# Patient Record
Sex: Male | Born: 1940 | Race: White | Hispanic: No | Marital: Married
Health system: Midwestern US, Academic
[De-identification: ages and names within clinical notes are randomized; demographics above are authoritative.]

---

## 2020-09-17 ENCOUNTER — Encounter: Admit: 2020-09-17 | Discharge: 2020-09-17

## 2020-09-17 DIAGNOSIS — R319 Hematuria, unspecified: Secondary | ICD-10-CM

## 2020-09-17 DIAGNOSIS — N281 Cyst of kidney, acquired: Secondary | ICD-10-CM

## 2020-10-13 ENCOUNTER — Encounter: Admit: 2020-10-13 | Discharge: 2020-10-13 | Payer: MEDICARE

## 2020-10-13 DIAGNOSIS — R319 Hematuria, unspecified: Secondary | ICD-10-CM

## 2020-10-13 DIAGNOSIS — N281 Cyst of kidney, acquired: Secondary | ICD-10-CM

## 2020-10-26 ENCOUNTER — Encounter: Admit: 2020-10-26 | Discharge: 2020-10-26 | Payer: MEDICARE

## 2020-10-26 ENCOUNTER — Ambulatory Visit: Admit: 2020-10-26 | Discharge: 2020-10-26 | Payer: MEDICARE

## 2020-10-26 DIAGNOSIS — R319 Hematuria, unspecified: Secondary | ICD-10-CM

## 2020-10-26 DIAGNOSIS — N281 Cyst of kidney, acquired: Secondary | ICD-10-CM

## 2020-10-26 MED ORDER — GADOBENATE DIMEGLUMINE 529 MG/ML (0.1MMOL/0.2ML) IV SOLN
20 mL | Freq: Once | INTRAVENOUS | 0 refills | Status: CP
Start: 2020-10-26 — End: ?
  Administered 2020-10-27: 20 mL via INTRAVENOUS

## 2021-05-27 IMAGING — MR MR abdomen wo/w con
6 of 15 series · 21 of 48 positions shown · IV contrast (agent unspecified)
Comparison: CT abdomen with IV contrast from outside institution from 10/10/2016. CT abdomen outside institution 05/26/2016

STUDY TYPE: MRI abdomen without and with IV contrast and MRI pelvis without and with IV contrast
TECHNIQUE: Multiplanar multi echo MR imaging was acquired through the abdomen and pelvis without and with IV contrast. 10 cc multi and contrast medium was injected for the postcontrast portion of the examination
HISTORY: Renal mass follow-up, enlarged prostate, erectile dysfunction

[Series 101: survey · axial · 10.0mm · 1.56mm/px · 1 of 15 slices shown]
[im 1/15]
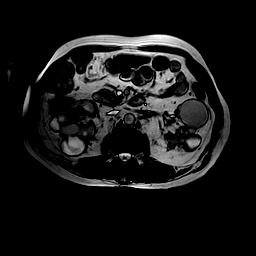

[Series 301: T1 fat-sat · axial · 4.0mm · 0.78mm/px · z∈[-150,+100]mm · 3 of 60 slices shown]
[im 1/60]
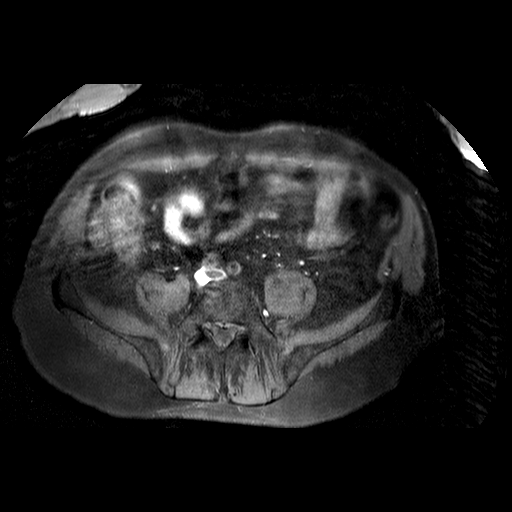
[im 30/60]
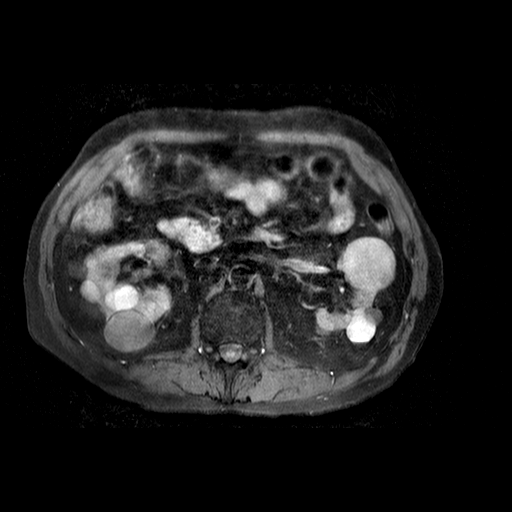
[im 60/60]
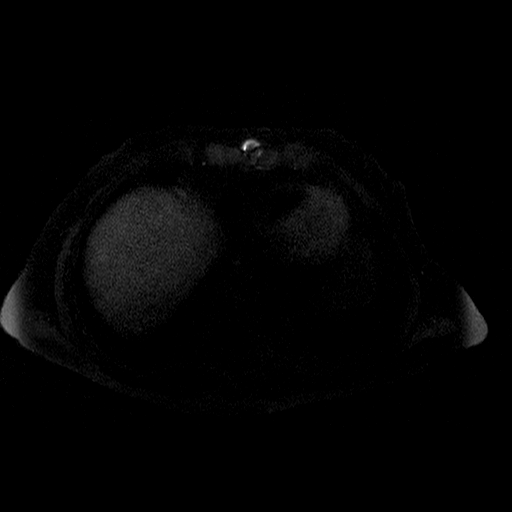

[Series 401: t2w_tse fatsat · axial · 4.0mm · 0.76mm/px · z∈[-121,+90]mm · 3 of 50 slices shown]
[im 1/50]
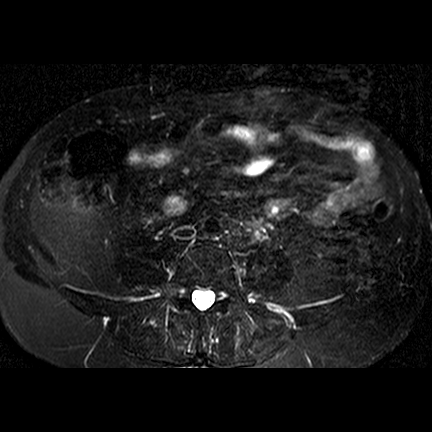
[im 25/50]
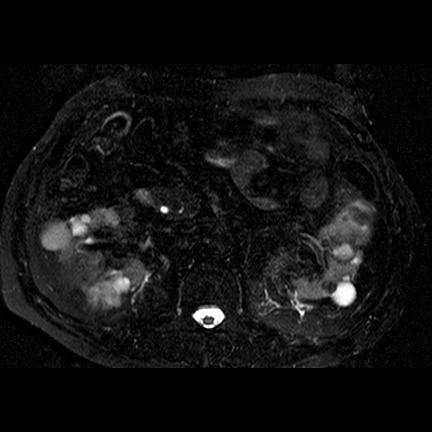
[im 50/50]
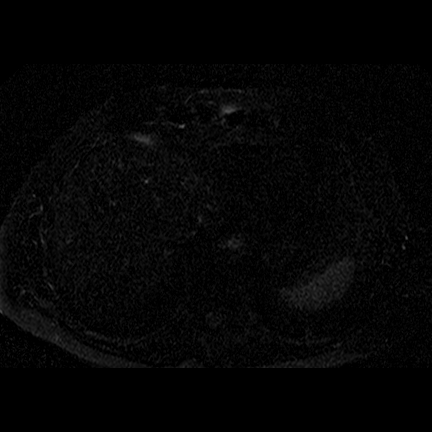

[Series 501: in/out phase · axial · 4.0mm · 0.78mm/px · z∈[-141,+109]mm · 8 of 120 slices shown]
[im 1/120]
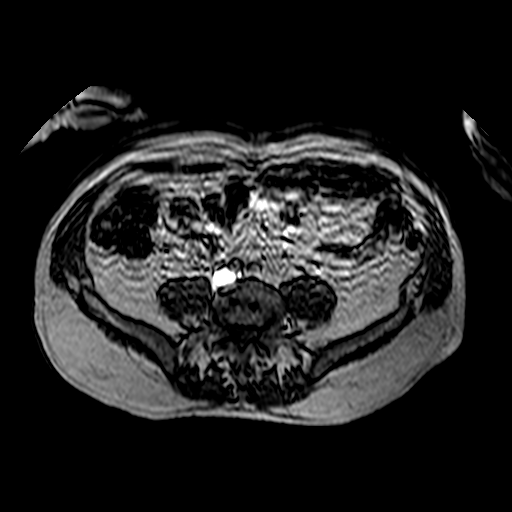
[im 18/120]
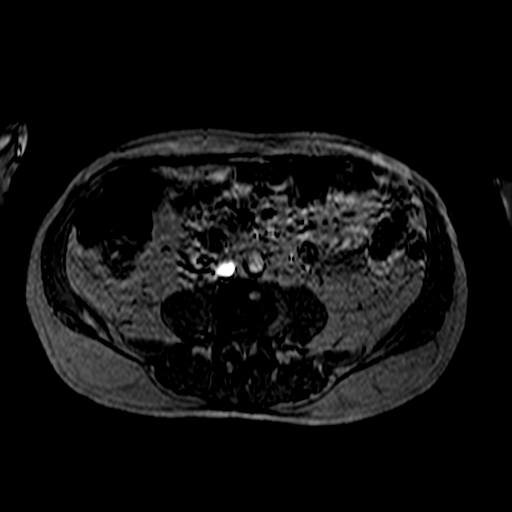
[im 35/120]
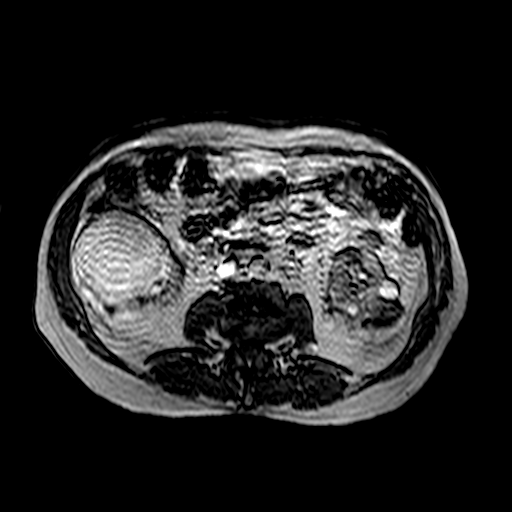
[im 52/120]
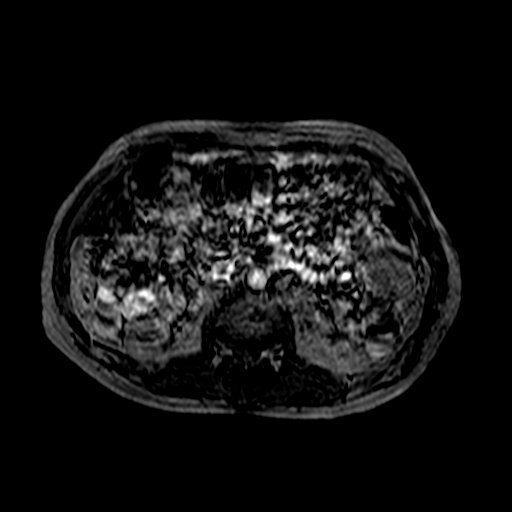
[im 69/120]
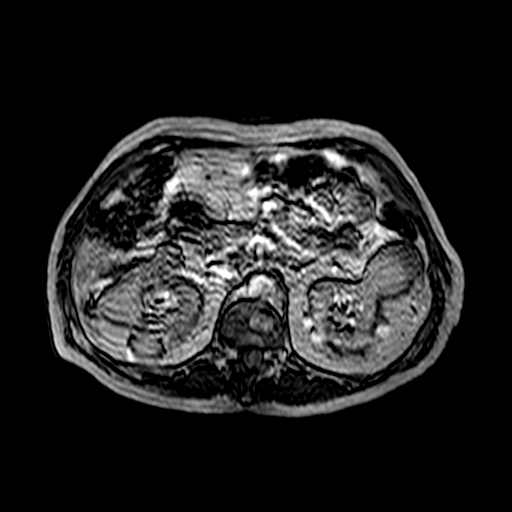
[im 86/120]
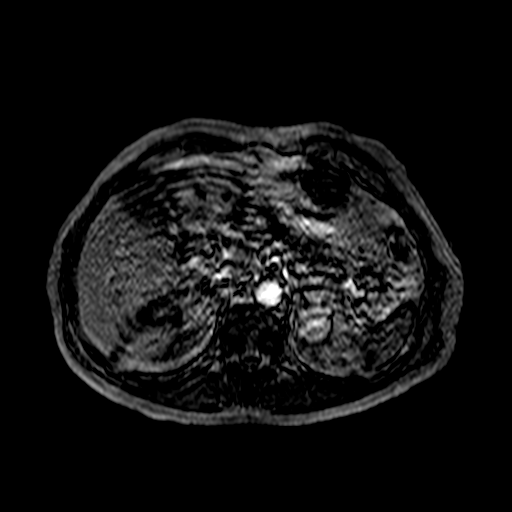
[im 103/120]
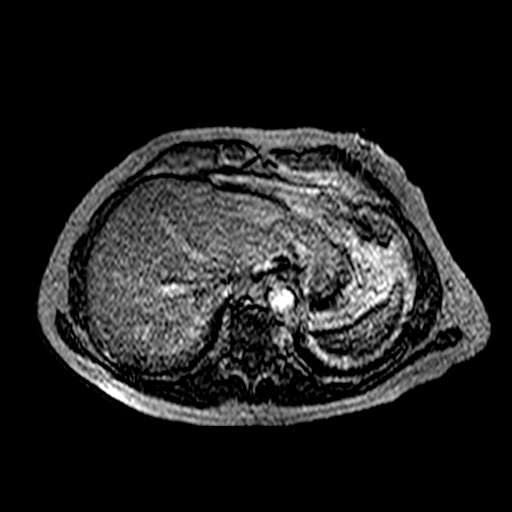
[im 120/120]
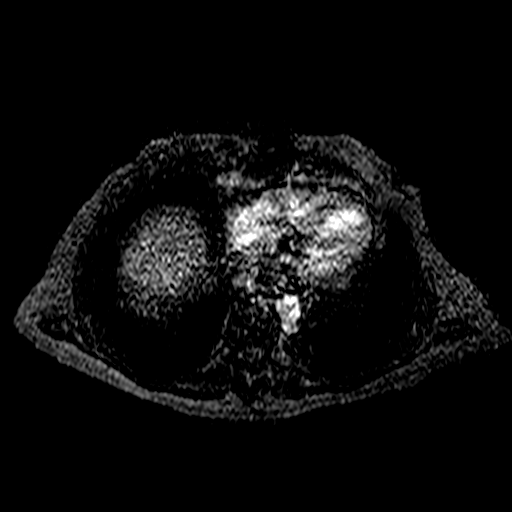

[Series 601: bffe_m2d · coronal · 5.0mm · 0.87mm/px · 3 of 41 slices shown]
[im 1/41]
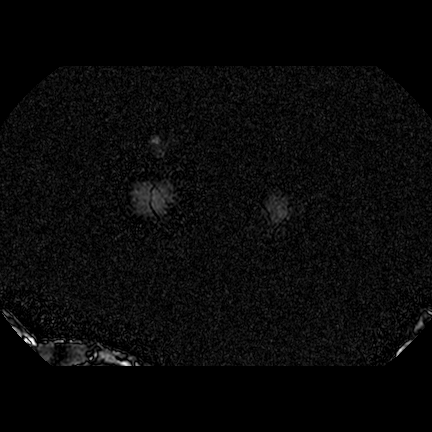
[im 21/41]
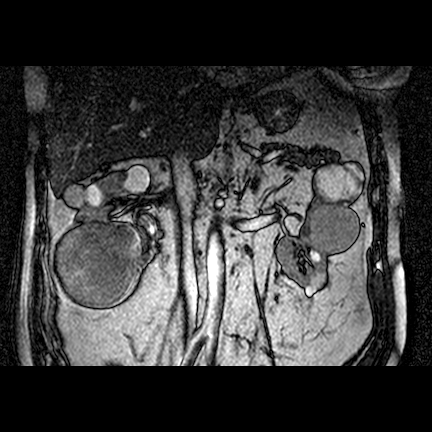
[im 41/41]
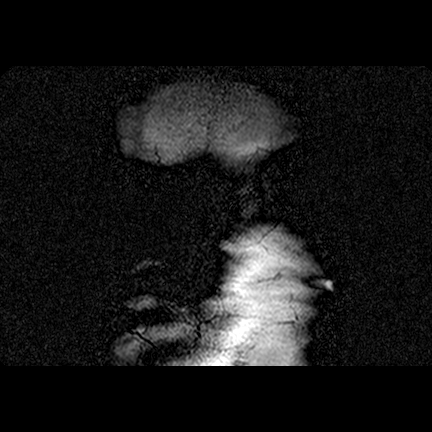

[Series 701: out of phase · axial · 4.0mm · 0.93mm/px · z∈[-141,+25]mm · 3 of 60 slices shown]
[im 1/60]
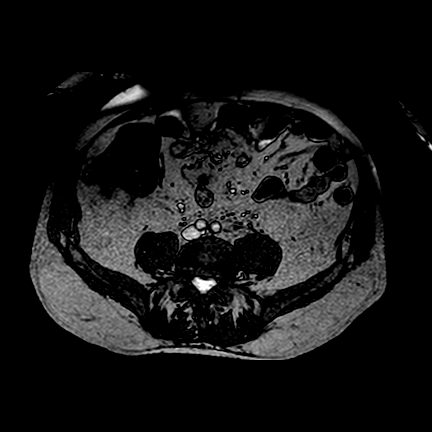
[im 20/60]
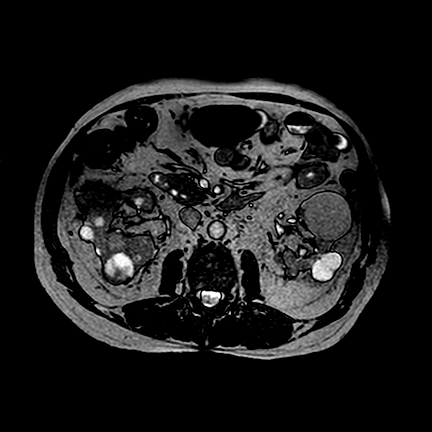
[im 40/60]
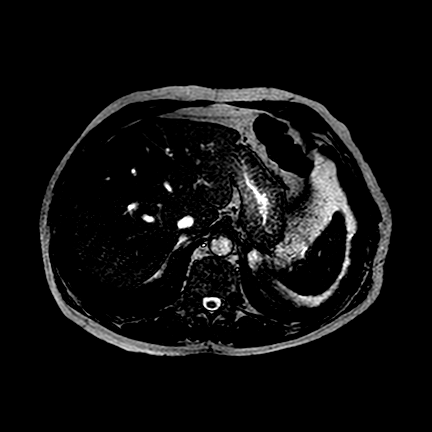

[21 of 48 positions shown; findings below may reference images not displayed]

FINDINGS: There is extensive motion artifact. Postcontrast images are extremely degraded. There are extensive bilateral renal masses. Dominant solid-appearing nonenhancing mass seen within the anterior right lower pole approximates 7.8 x 7.1 cm. This appears increased from prior CT examination of 5255. There appears to be a solid mass seen within the left anterior lower pole as well measuring 4.4 x 4.6 cm approximately. This is similar in size from previous examination. Overall configuration of masses is similar and there is mixed attenuation varying masses of different attenuation suggest complex cyst. CT comparison may be better given resolution and prior CT is available for comparison. (Pre and postcontrast CT)
There is no obvious hepatic steatosis. The gallbladder is distended. The pancreas and adrenal glands are grossly normal. There is no bowel dilatation or ascites.
Evaluation of the pelvis demonstrates enlarged heterogeneous prostate gland. There is a somewhat discrete and capsulated mass along the anterior lateral right prostate measuring 2.8 x 3.3 cm. I am uncertain with this region its within the central or peripheral zone of the gland. There is mass effect of the prostate gland at the base of the bladder. There is sigmoid colonic diverticular disease. There is no abnormal fluid collection or definite adenopathy. There is mild lumbar degenerative change. Urinary bladder is trabeculated. There is no hip joint effusion bilaterally. Soft tissues appear well preserved.
IMPRESSION: 1. There is marked image quality degradation due to patient motion in technique.
2. There is no obvious suspicious solid enhancing renal lesion. Mixed intensity and some solid lesions are seen within both kidneys. The dominant lesion on the right appears slightly larger but does not demonstrate significant enhancement. A pre and postcontrast CT comparison may be of better utility given prior CT comparison and the fact that it is less motion sensitive.
3. Prostatomegaly with a dominant prostatic lesion. This is not well characterized. Correlate with PSA. There is mass effect of the base of the urinary bladder and the urinary bladder is somewhat trabeculated.

## 2021-05-27 IMAGING — MR MR pelvis wo/w con
8 of 14 series · 29 of 48 positions shown · IV contrast (agent unspecified)
Comparison: CT abdomen with IV contrast from outside institution from 10/10/2016. CT abdomen outside institution 05/26/2016

STUDY TYPE: MRI abdomen without and with IV contrast and MRI pelvis without and with IV contrast
TECHNIQUE: Multiplanar multi echo MR imaging was acquired through the abdomen and pelvis without and with IV contrast. 10 cc multi and contrast medium was injected for the postcontrast portion of the examination
HISTORY: Renal mass follow-up, enlarged prostate, erectile dysfunction

[Series 101: survey · axial · 10.0mm · 1.56mm/px · z∈[+80,+328]mm · 2 of 15 slices shown (1 of 2)]
[im 1/15]
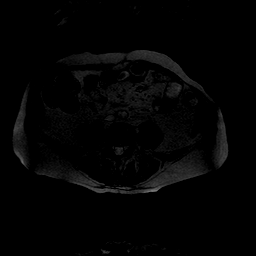
[im 15/15]
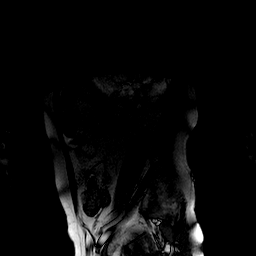

[Series 201: survey · axial · 10.0mm · 1.56mm/px · z∈[-101,+199]mm · 2 of 15 slices shown (2 of 2)]
[im 1/15]
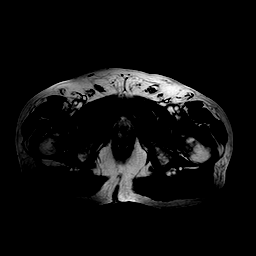
[im 15/15]
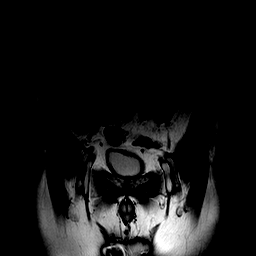

[Series 401: T2 · sagittal · 3.5mm · 0.78mm/px · 6 of 60 slices shown (1 of 2)]
[im 1/60]
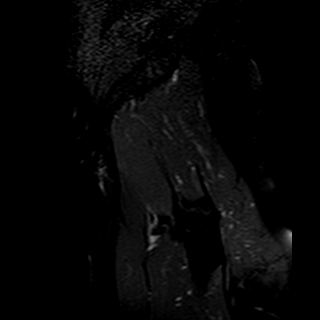
[im 12/60]
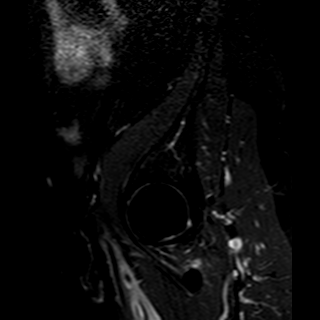
[im 24/60]
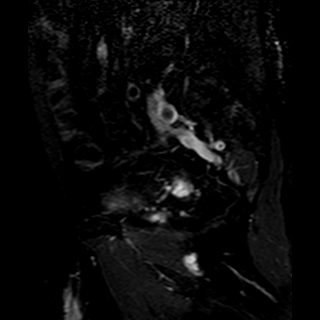
[im 36/60]
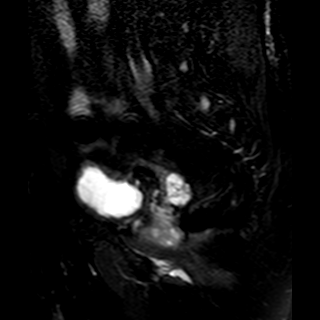
[im 48/60]
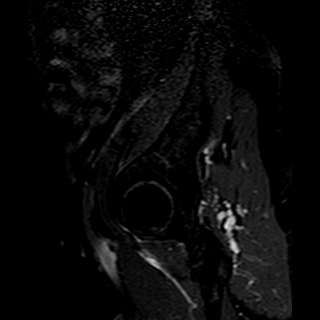
[im 60/60]
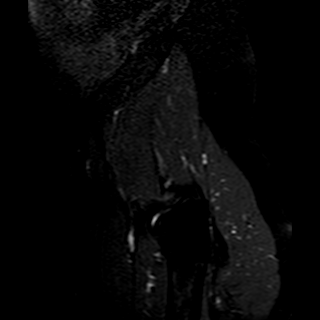

[Series 501: T2 · axial · 5.0mm · 0.74mm/px · z∈[-150,+81]mm · 4 of 45 slices shown (2 of 2)]
[im 1/45]
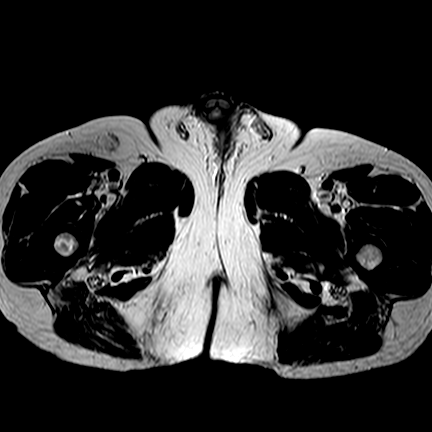
[im 15/45]
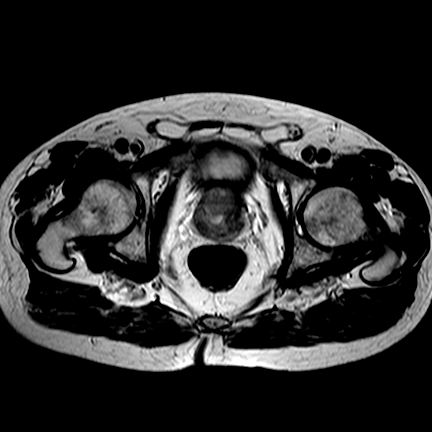
[im 30/45]
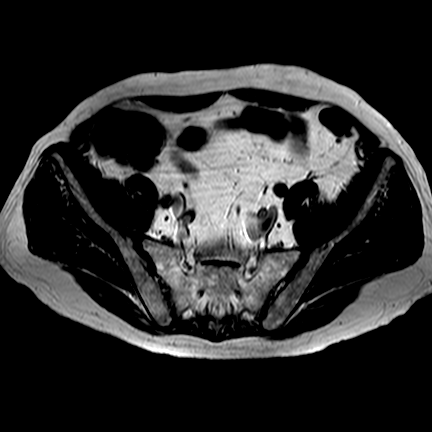
[im 45/45]
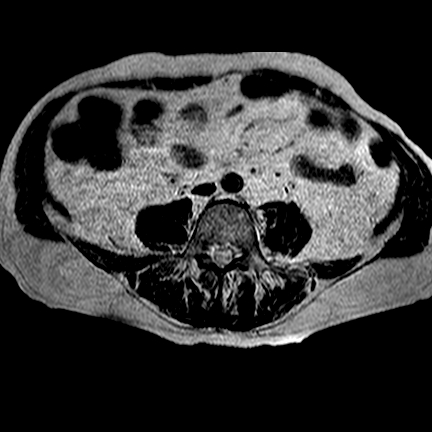

[Series 601: T1 · axial · 5.0mm · 0.74mm/px · z∈[-150,+81]mm · 4 of 45 slices shown]
[im 1/45]
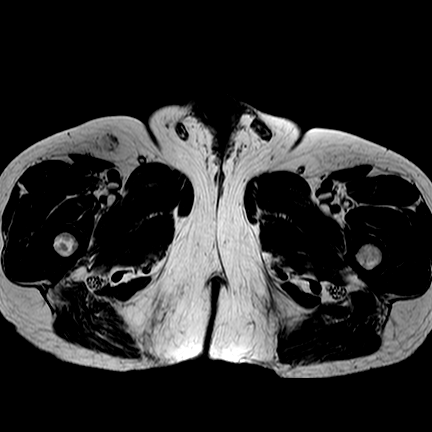
[im 15/45]
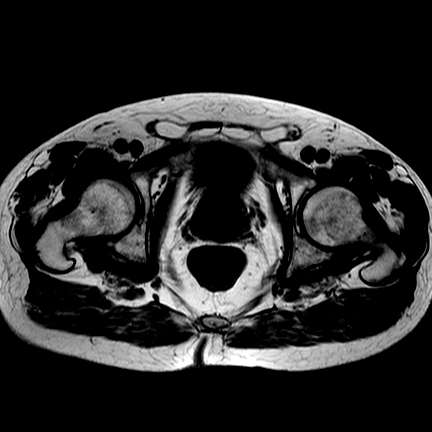
[im 30/45]
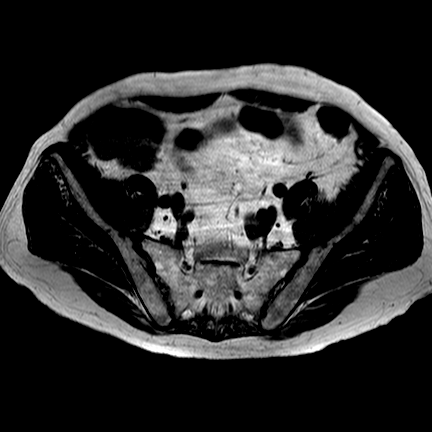
[im 45/45]
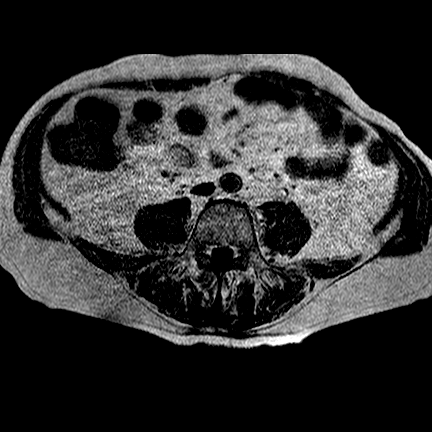

[Series 701: st1w_tse_cor · coronal · 5.0mm · 0.78mm/px · 4 of 40 slices shown]
[im 1/40]
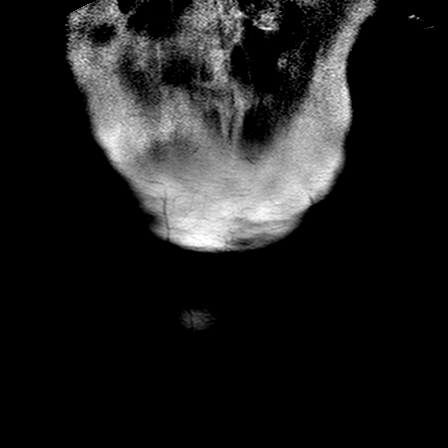
[im 14/40]
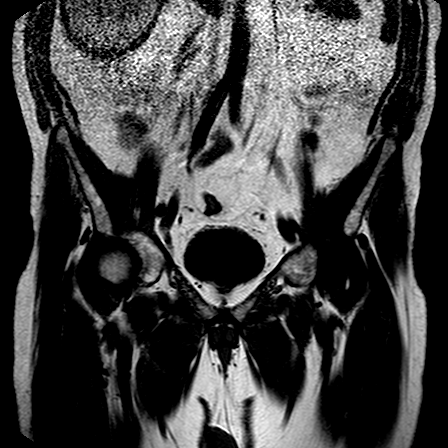
[im 27/40]
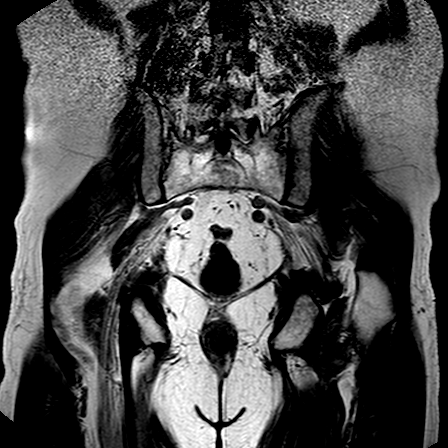
[im 40/40]
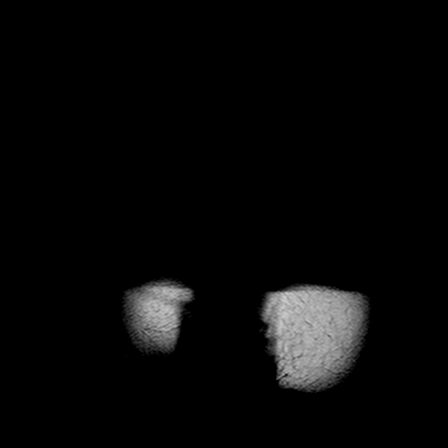

[Series 801: T1 fat-sat post-contrast · axial · 4.9mm · 0.74mm/px · z∈[-162,+69]mm · 4 of 45 slices shown]
[im 1/45]
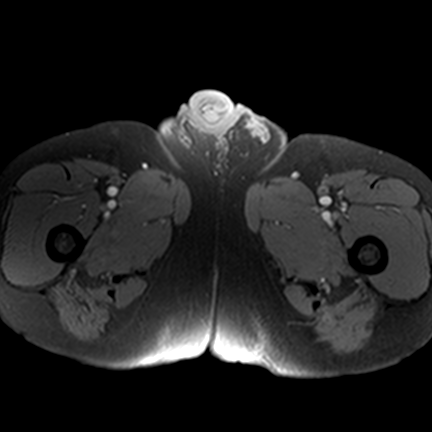
[im 15/45]
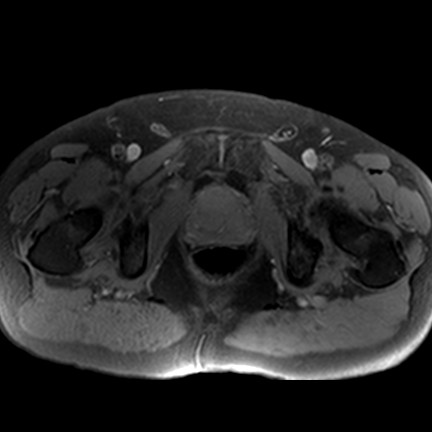
[im 30/45]
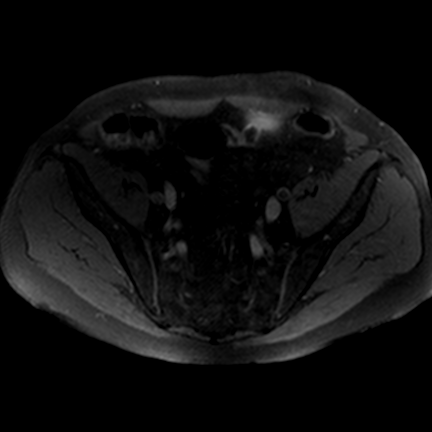
[im 45/45]
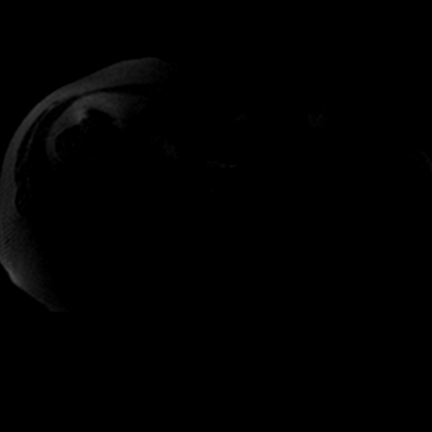

[Series 901: st2w_tse_cor · coronal · 5.0mm · 0.75mm/px · 3 of 39 slices shown]
[im 1/39]
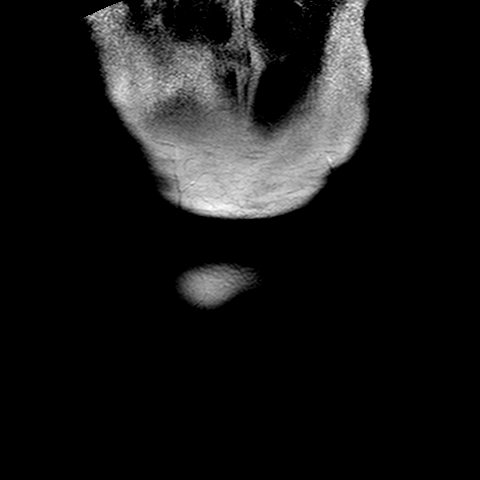
[im 13/39]
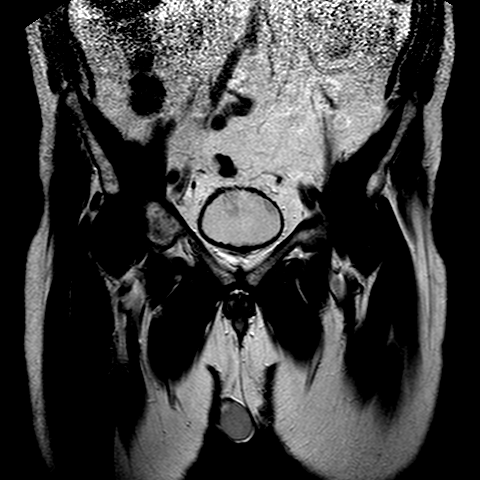
[im 26/39]
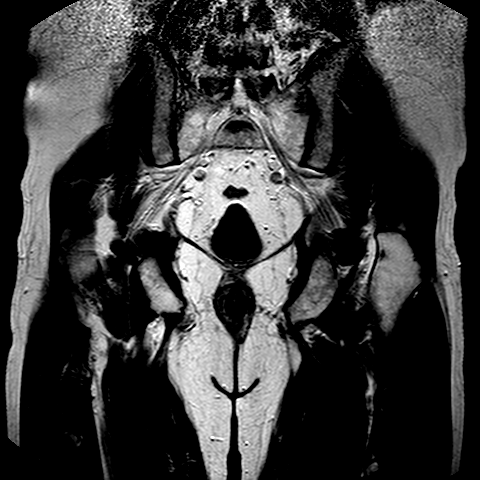

[29 of 48 positions shown; findings below may reference images not displayed]

FINDINGS: There is extensive motion artifact. Postcontrast images are extremely degraded. There are extensive bilateral renal masses. Dominant solid-appearing nonenhancing mass seen within the anterior right lower pole approximates 7.8 x 7.1 cm. This appears increased from prior CT examination of 5255. There appears to be a solid mass seen within the left anterior lower pole as well measuring 4.4 x 4.6 cm approximately. This is similar in size from previous examination. Overall configuration of masses is similar and there is mixed attenuation varying masses of different attenuation suggest complex cyst. CT comparison may be better given resolution and prior CT is available for comparison. (Pre and postcontrast CT)
There is no obvious hepatic steatosis. The gallbladder is distended. The pancreas and adrenal glands are grossly normal. There is no bowel dilatation or ascites.
Evaluation of the pelvis demonstrates enlarged heterogeneous prostate gland. There is a somewhat discrete and capsulated mass along the anterior lateral right prostate measuring 2.8 x 3.3 cm. I am uncertain with this region its within the central or peripheral zone of the gland. There is mass effect of the prostate gland at the base of the bladder. There is sigmoid colonic diverticular disease. There is no abnormal fluid collection or definite adenopathy. There is mild lumbar degenerative change. Urinary bladder is trabeculated. There is no hip joint effusion bilaterally. Soft tissues appear well preserved.
IMPRESSION: 1. There is marked image quality degradation due to patient motion in technique.
2. There is no obvious suspicious solid enhancing renal lesion. Mixed intensity and some solid lesions are seen within both kidneys. The dominant lesion on the right appears slightly larger but does not demonstrate significant enhancement. A pre and postcontrast CT comparison may be of better utility given prior CT comparison and the fact that it is less motion sensitive.
3. Prostatomegaly with a dominant prostatic lesion. This is not well characterized. Correlate with PSA. There is mass effect of the base of the urinary bladder and the urinary bladder is somewhat trabeculated.

## 2022-01-12 ENCOUNTER — Encounter: Admit: 2022-01-12 | Discharge: 2022-01-12 | Payer: MEDICARE

## 2022-01-20 ENCOUNTER — Encounter: Admit: 2022-01-20 | Discharge: 2022-01-20 | Payer: MEDICARE

## 2022-03-01 ENCOUNTER — Encounter: Admit: 2022-03-01 | Discharge: 2022-03-01 | Payer: MEDICARE

## 2022-03-01 DIAGNOSIS — H547 Unspecified visual loss: Secondary | ICD-10-CM

## 2022-03-01 DIAGNOSIS — I10 Essential (primary) hypertension: Secondary | ICD-10-CM

## 2022-03-01 DIAGNOSIS — R32 Unspecified urinary incontinence: Secondary | ICD-10-CM

## 2022-03-01 DIAGNOSIS — N289 Disorder of kidney and ureter, unspecified: Secondary | ICD-10-CM

## 2022-03-01 DIAGNOSIS — H269 Unspecified cataract: Secondary | ICD-10-CM

## 2022-03-01 DIAGNOSIS — G709 Myoneural disorder, unspecified: Secondary | ICD-10-CM

## 2022-03-01 DIAGNOSIS — H9190 Unspecified hearing loss, unspecified ear: Secondary | ICD-10-CM

## 2022-03-01 DIAGNOSIS — N401 Enlarged prostate with lower urinary tract symptoms: Secondary | ICD-10-CM

## 2022-03-01 NOTE — Progress Notes
.Date of Service: 03/01/2022     Subjective:             Joshua Sherman is a 81 y.o. male who presents for new patient visit.    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 Patient has been seen by urologists before, both at [facility] and [facility] Current urinary symptoms include nocturia, urgency, and incontinence. Patient voids 3x/night if he stops fluid intake around noon, voids at least 4-5x/night if he drinks fluids in the afternoon. Daytime frequency varies based on patient's amount of fluid intake. Endorses urinary urgency. Has incontinence when tired as a result of his MG. Does not wear pads or adult diapers. Patient regularly double voids in order to completely empty his bladder. Describes stream as weak, but steady. Has tried Flomax in the past for his BPH, but stopped the med after developing hypotension. Most recent cystoscopy in 2018 (done at [facility]) showed an obstructive prostate with moderate hyperplasia and the presence of a median lobe.     Patient was considering Rezume treatment, but was told by [facility] that his insurance would not cover the cost of surgery. After being told differently by [facility], patient decided to seek care at [facility] for definitive treatment of BPH. Patient would like to learn more about his options for surgical treatment. Of note, patient had IPP placed by Dr. Monika Salk at [facility] (08/2021) for his erectile dysfunction.     Denies gross hematuria, f/c, n/v. Not currently taking any blood thinners, has no significant cardiac or pulmonary history.         Medical History:   Diagnosis Date   ? Cataract 2006    Successful   ? 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 Hypertension 1995    Meds   ? Incontinence 2022    Awaken every 2 hours   ? Kidney disease 2000    MRI every 6 months   ? Neuromuscular disorder (HCC) 1977    Mild case of Myasthenia gravis   ? Vision decreased 1995    Use Glasses       Surgical History:   Procedure Laterality Date   ? COLONOSCOPY  2000    A small polyp removed   ? HX APPENDECTOMY  1952    Done at [facility]   ? HX SKIN BIOPSY  2020    Checked twice a year   ? HX TONSILLECTOMY  1950    Partial   ? HX VASECTOMY  [id number]   ? THYMECTOMY  1982    Because it was enlarged       Family History   Problem Relation Age of Onset   ? Unknown to Patient Mother         Adoption       Current Outpatient Medications   Medication Sig Dispense Refill   ? hydroCHLOROthiazide (HYDRODIURIL) 25 mg tablet      ? losartan (COZAAR) 100 mg tablet      ? predniSONE (DELTASONE) 5 mg tablet Take one tablet by mouth daily.     ? spironolactone (ALDACTONE) 25 mg tablet Take one-half tablet by mouth.       No current facility-administered medications for this visit.       No Known Allergies  Social History     Socioeconomic History   ? Marital status: Married   Tobacco Use   ? Smoking status: Never   ? Smokeless tobacco: Never   Substance and Sexual Activity   ? Alcohol use: Never   ? Drug use: Never   ? Sexual activity: Yes     Partners: Female     Birth control/protection: None       Review of Systems   Constitutional: Negative for chills and fever.   HENT: Negative.    Respiratory: Negative for shortness of breath.    Cardiovascular: Negative for chest pain.   Gastrointestinal: Negative for abdominal pain, nausea and vomiting.   Genitourinary: Positive for frequency and urgency. Negative for dysuria and hematuria.   Neurological: Negative.    Psychiatric/Behavioral: Negative.        Objective:         ? hydroCHLOROthiazide (HYDRODIURIL) 25 mg tablet    ? losartan (COZAAR) 100 mg tablet    ? predniSONE (DELTASONE) 5 mg tablet Take one tablet by mouth daily.   ? spironolactone (ALDACTONE) 25 mg tablet Take one-half tablet by mouth.     Vitals:    03/01/22 1248   BP: (!) 164/69   BP Source: Arm, Left Upper   Pulse: 85   Temp: 36.8 ?C (98.2 ?F)   SpO2: 98%   TempSrc: Temporal   PainSc: Zero   Weight: 74 kg (163 lb 3.2 oz)     Body mass index is 24.1 kg/m?Marland Kitchen       Physical Exam  Constitutional:       Appearance: Normal appearance.   HENT:      Head: Normocephalic and atraumatic.   Cardiovascular:      Rate and Rhythm: Normal rate and regular rhythm.   Pulmonary:      Effort: Pulmonary effort is normal. No respiratory distress.   Abdominal:      General: There is no distension.      Palpations: Abdomen is soft.      Tenderness: There is no abdominal tenderness.   Skin:     General: Skin is warm and dry.   Neurological:      General: No focal deficit present.      Mental Status: He is alert and oriented to person, place, and time.   Psychiatric:         Mood and Affect: Mood normal.            Assessment and Plan:  Joshua Sherman is an 81 y/o male with history of myasthenia gravis (diagnosed in 1977) and BPH w LUTS presenting for discussion of surgical options to definitely treat his enlarged prostate and associated urinary symptoms. Previously tried Flomax but subsequently developed hypotension. Given that last cystoscopy was in 2018, recommend that patient undergoes further workup to evaluate the current state of his prostate which will help determine the best surgical option to treat his BPH.    -RTC for cysto and TRUS for volume on 04/12/22  -will make surgical recommendation based on findings at that clinic visit    ATTESTATION    I personally performed or re-performed the history, physical exam and treatment for the E/M. I discussed the case with the Medical Student, and concur with the Medical Student documentation of history, physical exam and treatment plan unless otherwise noted.     Staff name:  Jefferson Fuel, MD Date: 03/01/2022

## 2022-04-11 ENCOUNTER — Encounter: Admit: 2022-04-11 | Discharge: 2022-04-11 | Payer: MEDICARE

## 2022-04-12 ENCOUNTER — Encounter: Admit: 2022-04-12 | Discharge: 2022-04-12 | Payer: MEDICARE

## 2022-04-12 DIAGNOSIS — H269 Unspecified cataract: Secondary | ICD-10-CM

## 2022-04-12 DIAGNOSIS — I10 Essential (primary) hypertension: Secondary | ICD-10-CM

## 2022-04-12 DIAGNOSIS — H9190 Unspecified hearing loss, unspecified ear: Secondary | ICD-10-CM

## 2022-04-12 DIAGNOSIS — N401 Enlarged prostate with lower urinary tract symptoms: Secondary | ICD-10-CM

## 2022-04-12 DIAGNOSIS — H547 Unspecified visual loss: Secondary | ICD-10-CM

## 2022-04-12 DIAGNOSIS — G709 Myoneural disorder, unspecified: Secondary | ICD-10-CM

## 2022-04-12 DIAGNOSIS — N289 Disorder of kidney and ureter, unspecified: Secondary | ICD-10-CM

## 2022-04-12 DIAGNOSIS — R32 Unspecified urinary incontinence: Secondary | ICD-10-CM

## 2022-04-12 MED ORDER — CIPROFLOXACIN HCL 500 MG PO TAB
500 mg | ORAL_TABLET | Freq: Two times a day (BID) | ORAL | 0 refills | 10.00000 days | Status: AC
Start: 2022-04-12 — End: ?

## 2022-04-12 MED ORDER — LIDOCAINE HCL 2 % MM JELP
Freq: Once | TOPICAL | 0 refills | Status: CP
Start: 2022-04-12 — End: ?
  Administered 2022-04-12: 19:00:00 11.000 mL via TOPICAL

## 2022-04-12 MED ORDER — WATER FOR INJECTION, STERILE IV SOLP
1000 mL | Freq: Once | 0 refills | Status: CP
Start: 2022-04-12 — End: ?
  Administered 2022-04-12: 19:00:00 1000 mL

## 2022-04-12 NOTE — Procedures
Procedure:  Transrectal ultrasound    Surgeon: Jefferson Fuel, MD    Anesthesia:  none    Complications:  None.    Blood Loss:  Negligible.    Indication for Procedure:  81 y.o. male w/ BPH and LUTS.      Procedure:  He was placed in the left lateral decubitus position.  Time out was performed with verification of patient name and date of birth by the patient and team.  DRE performed.  No  palpable prostate nodules or induration.  7.5 MHz TRUS introduced into rectum w/o difficulty.  Prostate visualized & scanned from base to apex & lateral lobe to lateral lobe.      TRUS Prostate Vol = 58 mL      He tolerated the procedure well, and left the exam room in a pleasant disposition.  Verbal & written post-procedure instructions given to pt.    ASSESSMENT/ PLAN:    1.  BPH with LUTS   See cystoscopy note.

## 2022-04-12 NOTE — Patient Instructions
Medical Office building   [address]   [place] [address]    Urology phone number [phone] Option Number 1 for appointments

## 2022-04-25 ENCOUNTER — Encounter: Admit: 2022-04-25 | Discharge: 2022-04-25 | Payer: MEDICARE

## 2022-04-25 NOTE — Telephone Encounter
Patient called and LVM, stating he was under the impression that he would be getting in to see Dr. Teressa Senter within a few weeks. Currently he is scheduled for 06/16/22. Is not pleased with the delay. Requesting a return call. Returned call and explained the scheduling process and that I would try to speak with Dr. Teressa Senter to see if he could be seen sooner for an appointment. States he was under the impression that Dr. Rosana Hoes had spoken with Dr. Teressa Senter. Contacted Dr. Teressa Senter and she will plan to see patient on 05/12/22 and will contact her AA for scheduling.

## 2022-04-26 ENCOUNTER — Encounter: Admit: 2022-04-26 | Discharge: 2022-04-26 | Payer: MEDICARE

## 2022-04-28 ENCOUNTER — Encounter: Admit: 2022-04-28 | Discharge: 2022-04-28 | Payer: MEDICARE

## 2022-04-28 NOTE — Telephone Encounter
Patient Joshua Sherman in regards to his upcoming appt with Dr. Teressa Senter and stating he is tired of getting up every 1 hour to urinate. He stated he was notified of a January appt and is frustrated that he has to wait until end of February. Returned patient's call. Apologized for delayed appt time. Discussed that unfortunately all of our providers are booking out several months. His sx are the same since seeing Dr. Rosana Hoes and have not worsened, but are understandably bothersome. I assured him he is on the cancellation list and our office will contact him if there is any opening. Apologized for the confusion regarding a January date. Informed him I did not see anything to suggest this. He v/u and thanked Therapist, sports for the call back.

## 2022-06-14 ENCOUNTER — Encounter: Admit: 2022-06-14 | Discharge: 2022-06-14 | Payer: MEDICARE

## 2022-06-14 NOTE — Telephone Encounter
Returned pt call.  He is scheduled for consult with Dr. Teressa Senter on 06/16/22.  Wants to know if he will have surgery that day.  I explained it was for a consult only and if surgery warranted, a date will be set at appt.  Pt wants to know if he can do a video visit for the consult d/t 2 hour drive time to get to our office.  Will inquire and let him know later today if that is possible for consult visit to be virtual.

## 2022-06-16 ENCOUNTER — Ambulatory Visit: Admit: 2022-06-16 | Discharge: 2022-06-17 | Payer: MEDICARE

## 2022-06-16 ENCOUNTER — Encounter: Admit: 2022-06-16 | Discharge: 2022-06-16 | Payer: MEDICARE

## 2022-06-16 ENCOUNTER — Inpatient Hospital Stay: Admit: 2022-06-16 | Discharge: 2022-06-16 | Payer: MEDICARE

## 2022-06-16 DIAGNOSIS — N401 Enlarged prostate with lower urinary tract symptoms: Secondary | ICD-10-CM

## 2022-06-16 DIAGNOSIS — H9190 Unspecified hearing loss, unspecified ear: Secondary | ICD-10-CM

## 2022-06-16 DIAGNOSIS — R32 Unspecified urinary incontinence: Secondary | ICD-10-CM

## 2022-06-16 DIAGNOSIS — H547 Unspecified visual loss: Secondary | ICD-10-CM

## 2022-06-16 DIAGNOSIS — N529 Male erectile dysfunction, unspecified: Secondary | ICD-10-CM

## 2022-06-16 DIAGNOSIS — N289 Disorder of kidney and ureter, unspecified: Secondary | ICD-10-CM

## 2022-06-16 DIAGNOSIS — N400 Enlarged prostate without lower urinary tract symptoms: Secondary | ICD-10-CM

## 2022-06-16 DIAGNOSIS — G709 Myoneural disorder, unspecified: Secondary | ICD-10-CM

## 2022-06-16 DIAGNOSIS — I10 Essential (primary) hypertension: Secondary | ICD-10-CM

## 2022-06-16 DIAGNOSIS — H269 Unspecified cataract: Secondary | ICD-10-CM

## 2022-06-16 DIAGNOSIS — K635 Polyp of colon: Secondary | ICD-10-CM

## 2022-06-16 NOTE — Telephone Encounter
Contacted patient to review HoLEP surgery date and pre-op instructions. Informed patient the pre-op instructions and HoLEP education is in his AVS on MyChart for his reference. Advised that I would send a MyChart msg w/ lab orders attached so he can print and take somewhere close to home. Provided him w/ our call back number and clinic fax number. Asked that he call if he has any questions. He thanked Therapist, sports for the call.

## 2022-06-16 NOTE — Progress Notes
Urology Clinic Note  Date of Service: 06/16/2022   Referred by: Dr. Luz Brazen    Subjective               Joshua Sherman is a 82 y.o. male who presents today for discission of surgical management of BPH.    History of Present Illness  Joshua Sherman is an 82yo M with history of HTN, myasthenia gravis (takes 5 mg prednisone daily for this), ED s/p IPP in 2023, BPH with urinary frequency and incontinence.    2 years of LUTS. Most bothersome symptom is nocturia 4x/night also experiences weak stream, frequency, urgency with incontinence 1x/month. Joshua Sherman is very motivated to get surgery completed soon as Joshua Sherman is exceptionally bothered by symptoms. Joshua Sherman is not currently on medications and is not interested in starting any at this time either.    Prior work up has included:   -- Cystoscopy: trilobar hypertrophy of the prostate (03/2022)  -- TRUS volume study: 58g (03/2022)  -- denies past prostate surgeries    Other pertinent urologic history includes:   -- IPP placed by Dr. Monika Salk at [facility] (08/2021)    -- Last PSA obtained was 5.8 in 2019, no biopsy done    (-) anticoagulation use    Lives in [place], [place] wants pre-op labs sent to [facility].       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    Hypertension 1995    Meds    Incontinence 2022    Awaken every 2 hours    Kidney disease 2000    MRI every 6 months    Neuromuscular disorder (HCC) 1977    Mild case of Myasthenia gravis    Vision decreased 1995    Use Glasses     Surgical History:   Procedure Laterality Date    COLONOSCOPY  2000    A small polyp removed    CYSTOURETHROSCOPY      Just observed twice    HX APPENDECTOMY  1952    Done at [facility]    HX CATARACT REMOVAL  2017    HX SKIN BIOPSY  2020    Checked twice a year    HX TONSILLECTOMY  1950 Partial    HX VASECTOMY  09-1967    PENIS SURGERY  05-18-21    THYMECTOMY  1982    Because it was enlarged     Family History   Problem Relation Age of Onset    Unknown to Patient Mother         Adoption     Current Outpatient Medications   Medication Sig Dispense Refill    hydroCHLOROthiazide (HYDRODIURIL) 25 mg tablet       losartan (COZAAR) 100 mg tablet       predniSONE (DELTASONE) 5 mg tablet Take one tablet by mouth daily.      spironolactone (ALDACTONE) 25 mg tablet Take one-half tablet by mouth.       No current facility-administered medications for this visit.     No Known Allergies  Social History     Socioeconomic History    Marital status: Married   Tobacco Use    Smoking status:  Never    Smokeless tobacco: Never   Substance and Sexual Activity    Alcohol use: Never    Drug use: Never    Sexual activity: Yes     Partners: Female     Birth control/protection: None       Review of Systems   Constitutional: Negative.    HENT: Negative.     Eyes: Negative.    Respiratory: Negative.     Cardiovascular: Negative.    Gastrointestinal: Negative.    Endocrine: Negative.    Genitourinary:  Positive for frequency and urgency.   Musculoskeletal: Negative.    Neurological: Negative.        Objective             hydroCHLOROthiazide (HYDRODIURIL) 25 mg tablet     losartan (COZAAR) 100 mg tablet     predniSONE (DELTASONE) 5 mg tablet Take one tablet by mouth daily.    spironolactone (ALDACTONE) 25 mg tablet Take one-half tablet by mouth.     Vitals:    06/16/22 0801   PainSc: Zero     There is no height or weight on file to calculate BMI.     Physical Exam  HENT:      Head: Atraumatic.   Eyes:      Conjunctiva/sclera: Conjunctivae normal.   Pulmonary:      Effort: Pulmonary effort is normal.   Neurological:      Mental Status: Joshua Sherman is alert and oriented to person, place, and time.   Psychiatric:         Mood and Affect: Mood normal.         Behavior: Behavior normal.         Thought Content: Thought content normal.   Full PE not able to be completed d/t telehealth       Assessment and Plan   Joshua Sherman is an 82yo M with history of HTN, myasthenia gravis, BPH with significant LUTS here as referral from Dr. Earlene Plater for HoLEP.      Diagnoses and all orders for this visit:    BPH associated with nocturia  -     PROSTATIC SPECIFIC ANTIGEN-PSA; Future; Expected date: 06/16/2022  -     CBC; Future; Expected date: 06/16/2022  -     BASIC METABOLIC PANEL; Future; Expected date: 06/16/2022  -     CULTURE-URINE W/SENSITIVITY; Future; Expected date: 06/16/2022       Patient Active Problem List    Diagnosis Date Noted    BPH associated with nocturia 06/16/2022     Overview Note:     06/16/2022 - initial evaluation for HoLEP, 58g gland, plan for OR 06/29/2022       Assessment & Plan Note:     - discussed benefits and risks of HoLEP and all questions answered  - prep for case in for 06/29/2022 for HoLEP with Dr. Nance Pear  - consent will need to be completed on day of surgery d/t telehealth visit  - pre-op labs (PSA, CBC, BMP, UC) sent to patient preferred lab closer to home               Patient seen and discussed with Dr. Nance Pear who directed plan of care.    Ladon Applebaum, MS4    --     Attending Attestation    I personally saw and evaluated the patient, reviewed the medical records as well as any of the recently obtained labs and/or imaging. I agree with the medical student's  documentation and plan.    82yo M with bothersome LUTS, BPH 58g gland, not currently on any medications but interested in surgery. Reviewed options based on prostate size. Considering her has an IPP, I would be most comfortable performing a HoLEP over an Aquablation or other MIST. Via shared decision making, we will plan for HoLEP.    Visit completed on telehealth. Alert, oriented, EOMI, no focal deficits.    We reviewed the benefits and risks including but not limited to bleeding including possible need for transfusion, infection, sepsis, need for staged or second look procedure, injury to urinary tract or surrounding structures, stricture, bladder neck contracture, retrograde ejaculation, incidental prostate cancer finding, urinary incontinence including both transient and persistent incontinence. Reviewed 1 night hospital stay for [facility] considering long drive time for them to [place].    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  Loralie Champagne, MD  Urology Staff

## 2022-06-16 NOTE — Assessment & Plan Note
-   discussed benefits and risks of HoLEP and all questions answered  - prep for case in for 06/29/2022 for HoLEP with Dr. Teressa Senter  - consent will need to be completed on day of surgery d/t telehealth visit  - pre-op labs (PSA, CBC, BMP, UC) sent to patient preferred lab closer to home

## 2022-06-17 DIAGNOSIS — R350 Frequency of micturition: Secondary | ICD-10-CM

## 2022-06-19 ENCOUNTER — Encounter: Admit: 2022-06-19 | Discharge: 2022-06-19 | Payer: MEDICARE

## 2022-06-19 ENCOUNTER — Ambulatory Visit: Admit: 2022-06-19 | Discharge: 2022-06-20 | Payer: MEDICARE

## 2022-06-19 DIAGNOSIS — G7000 Myasthenia gravis without (acute) exacerbation: Secondary | ICD-10-CM

## 2022-06-19 DIAGNOSIS — H547 Unspecified visual loss: Secondary | ICD-10-CM

## 2022-06-19 DIAGNOSIS — I10 Essential (primary) hypertension: Secondary | ICD-10-CM

## 2022-06-19 DIAGNOSIS — N400 Enlarged prostate without lower urinary tract symptoms: Secondary | ICD-10-CM

## 2022-06-19 DIAGNOSIS — N401 Enlarged prostate with lower urinary tract symptoms: Secondary | ICD-10-CM

## 2022-06-19 DIAGNOSIS — R32 Unspecified urinary incontinence: Secondary | ICD-10-CM

## 2022-06-19 DIAGNOSIS — H269 Unspecified cataract: Secondary | ICD-10-CM

## 2022-06-19 DIAGNOSIS — G709 Myoneural disorder, unspecified: Secondary | ICD-10-CM

## 2022-06-19 DIAGNOSIS — N289 Disorder of kidney and ureter, unspecified: Secondary | ICD-10-CM

## 2022-06-19 DIAGNOSIS — N529 Male erectile dysfunction, unspecified: Secondary | ICD-10-CM

## 2022-06-19 DIAGNOSIS — M1990 Unspecified osteoarthritis, unspecified site: Secondary | ICD-10-CM

## 2022-06-19 DIAGNOSIS — H9190 Unspecified hearing loss, unspecified ear: Secondary | ICD-10-CM

## 2022-06-19 DIAGNOSIS — K635 Polyp of colon: Secondary | ICD-10-CM

## 2022-06-19 DIAGNOSIS — N183 CKD (chronic kidney disease) stage 3, GFR 30-59 ml/min (HCC): Secondary | ICD-10-CM

## 2022-06-19 NOTE — Unmapped
[facility] Anesthesia Pre-Procedure Evaluation    Name: Joshua Sherman      MRN: [id number]     DOB: [birth year]     Age: 82 y.o.     Sex: male   _________________________________________________________________________     Procedure Info:   Procedure Information       Date: 06/29/22    Procedure: LASER ENUCLEATION PROSTATE WITH MORCELLATION - COMPLETE - 2 hrs    Location: Main OR/Periop    Surgeons: Benetta Spar, MD            Physical Assessment  Vital Signs (last filed in past 24 hours):  BP: 140/70 (02/26 0943)  Height: 172.7 cm (5' 8) (02/26 1191)  Weight: 71.7 kg (158 lb) (02/26 0943)      Patient History   No Known Allergies     Current Medications    Medication Directions   Flaxseed Oil 1,000 mg cap Take one thousand capsules by mouth daily.   Ginger (Zingiber officinalis) (GINGER EXTRACT) 250 mg cap Take  by mouth.   hydroCHLOROthiazide (HYDRODIURIL) 25 mg tablet Take one tablet by mouth daily.   losartan (COZAAR) 100 mg tablet Take one tablet by mouth daily.   multivitamin (ONE-A-DAY) tablet Take one tablet by mouth daily.   predniSONE (DELTASONE) 5 mg tablet Take one tablet by mouth daily.   spironolactone (ALDACTONE) 25 mg tablet Take one-half tablet to one tablet by mouth daily as needed.   vitamins, B complex tablet Take one tablet by mouth daily.       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        BPH       Physical Exam    Airway Findings      Mallampati: III      TM distance: <3 FB      Neck ROM: full      Mouth opening: good      Upper Lip Bite Test: 1  Dental Findings: Negative      Neurological Findings:       Alert and oriented x 3    Normal mental status    Constitutional findings:       No acute distress      Well-developed      Well-nourished       Diagnostic Tests  Hematology: No results found for: HGB, HCT, PLTCT, WBC, NEUT, ANC, LYMPH, ALC, ABSLYMPHCT, MONA, AMC, EOSA, ABC, BASOPHILS, MCV, MCH, MCHC, MPV, RDW      General Chemistry: No results found for: NA, K, CL, CO2, GAP, BUN, CR, GLU, CA, KETONES, ALBUMIN, LACTIC, OBSCA, MG, TOTBILI, TOTBILCB, PO4   Coagulation: No results found for: PT, PTT, INR    EKG 05/2016 (CE)  NSR with HR; 78    Abd MRI 10/2020 (pt denies cancer)  1. Limited evaluation of the ureters as above without discrete filling defect within the opacified portions of the upper urothelial tracts.  2. Small 1.1 cm enhancing posterior left renal nodule concerning for a small renal cell carcinoma.  3. Innumerable simple and hemorrhagic/proteinaceous renal cysts/cystic lesions. Dominant left renal cystic lesion demonstrates focal thickened enhancing septation (Bosniak 3).   Bosniak 3 masses have an intermediate probability of malignancy. Given this finding and enhancing left renal nodule, consider urologic consultation if not already obtained.     PAC RISK ASSESSMENTS:   *Duke Activity Status Index (DASI): 44.7  , Calculated METS: 8.23   (score < 25 correlates with increased risk for death, MI, and moderate to severe complications, max score 57)  *Revised Cardiac Risk Index (RCRI): 0 points=0.4% risk for major adverse cardiac events (myocardial infarction, pulmonary edema, cardiac arrest)  *STOP-BANG Score: 4   (total 5-8 high risk for OSA, 3-4 with HCO3 >28 also high risk. OSA associated with greater than twice the odds for respiratory failure, cardiac events, ICU admission, and difficult intubation)      PAC Plan    Interview: Telehealth Interview    ASA Score: 3                              CBC, CMP from 05/30/22 from coffey document in CE: hgb: 16.1,Pl: 242. Na:136, K:4.3, CR: 2.0. glucose:99. GFR:32.    CBC, CMP reviewed from 06/16/22 in outside record:  Hgb: 15.7, Pl: 289, WBC: 12.7 on chronic steroid for MG. Denies any concern for recent infection.  NA: 136, K: 4.5, CR:1.8      Alerts

## 2022-06-20 ENCOUNTER — Encounter: Admit: 2022-06-20 | Discharge: 2022-06-20 | Payer: MEDICARE

## 2022-06-20 DIAGNOSIS — N401 Enlarged prostate with lower urinary tract symptoms: Secondary | ICD-10-CM

## 2022-06-28 ENCOUNTER — Encounter: Admit: 2022-06-28 | Discharge: 2022-06-28 | Payer: MEDICARE

## 2022-06-28 NOTE — Telephone Encounter
Lynelle Smoke, NP LVM regarding pt. She stated he was seen by his ophthalmologist for sudden loss of vision in left eye. 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 Pt then went to his PCP who has ordered a stroke w/u including MRI, carotid doppler, ECHO, and labs. She has not yet received results and is wondering if Dr. Teressa Senter wants to proceed with surgery.    Reviewed w/ Dr. Teressa Senter. Will cancel surgery.     Contacted Lynelle Smoke, NP. Informed her that Dr. Teressa Senter recommends canceling surgery at this time. She does not yet have pt's results, but provided her our fax number and she will send once she has them. Advised I will contact pt that we are canceling his surgery.    Attempted to contact pt. His wife answered his phone as he is currently undergoing his MRI. Informed Enid Derry that we will have to cancel pt's surgery while we await his results. She voiced that pt will be upset as he has been waiting 3 months for this. I apologized, but advised it was important we work this up first. She understood. Provided her w/ our call back number per request.

## 2022-06-29 ENCOUNTER — Encounter: Admit: 2022-06-29 | Discharge: 2022-06-29 | Payer: MEDICARE

## 2022-06-30 ENCOUNTER — Encounter: Admit: 2022-06-30 | Discharge: 2022-06-30 | Payer: MEDICARE

## 2022-06-30 ENCOUNTER — Ambulatory Visit: Admit: 2022-06-30 | Discharge: 2022-06-30 | Payer: MEDICARE

## 2022-06-30 DIAGNOSIS — H269 Unspecified cataract: Secondary | ICD-10-CM

## 2022-06-30 DIAGNOSIS — N400 Enlarged prostate without lower urinary tract symptoms: Secondary | ICD-10-CM

## 2022-06-30 DIAGNOSIS — R32 Unspecified urinary incontinence: Secondary | ICD-10-CM

## 2022-06-30 DIAGNOSIS — H9190 Unspecified hearing loss, unspecified ear: Secondary | ICD-10-CM

## 2022-06-30 DIAGNOSIS — N289 Disorder of kidney and ureter, unspecified: Secondary | ICD-10-CM

## 2022-06-30 DIAGNOSIS — M1990 Unspecified osteoarthritis, unspecified site: Secondary | ICD-10-CM

## 2022-06-30 DIAGNOSIS — Z961 Presence of intraocular lens: Secondary | ICD-10-CM

## 2022-06-30 DIAGNOSIS — H547 Unspecified visual loss: Secondary | ICD-10-CM

## 2022-06-30 DIAGNOSIS — N529 Male erectile dysfunction, unspecified: Secondary | ICD-10-CM

## 2022-06-30 DIAGNOSIS — K635 Polyp of colon: Secondary | ICD-10-CM

## 2022-06-30 DIAGNOSIS — H3412 Central retinal artery occlusion, left eye: Secondary | ICD-10-CM

## 2022-06-30 DIAGNOSIS — G709 Myoneural disorder, unspecified: Secondary | ICD-10-CM

## 2022-06-30 DIAGNOSIS — I10 Essential (primary) hypertension: Secondary | ICD-10-CM

## 2022-06-30 DIAGNOSIS — G7000 Myasthenia gravis without (acute) exacerbation: Secondary | ICD-10-CM

## 2022-06-30 DIAGNOSIS — N183 CKD (chronic kidney disease) stage 3, GFR 30-59 ml/min (HCC): Secondary | ICD-10-CM

## 2022-06-30 MED ORDER — FLUORESCEIN 500 MG/5 ML (10 %) IV SOLN
2 mL | Freq: Once | INTRAVENOUS | 0 refills | Status: CP
Start: 2022-06-30 — End: ?
  Administered 2022-06-30: 22:00:00 2 mL via INTRAVENOUS

## 2022-06-30 NOTE — Telephone Encounter
Patient left message on triage requesting urgent appointment for dx of CRAO. Returned call; patient states he was seen at outside ophthalmologist (Dr. Evalina Field in [place]) two days ago after sudden vision loss left eye. See [place] phone note in chart from 06/28/22. Scheduled in [facility] slot with Dr. Miguel Aschoff today 3/8 2:15pm. No further action required.

## 2022-06-30 NOTE — Progress Notes
There is no height or weight on file to calculate BMI.      Carotids doppler 06/28/2022  1- 50-69 percent stenosis of bilateral common internal carotids arretries (greater on right.  2- antegrade flow within bilateral vertebral arteries    MRI brain was unremarkable    Transthoracic echo:    Left vetricle moderate focal basal and mild concetric hypertrophy. Estimated ejectin fraction 55-60%. Grad 1 diastolic dyfunction  Aortic valve: mild regurgitation  Tricusped valve: regus=rgitation peak velocity 2.53msec    OCt;  OD: Preserved foveal contour with distinguished retinal layers.  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 Planned surgery but cancelled due to vision loss      1- CRAO OS  On baby aspirin  Patient will f/u with own pcp for systemic control to prevent similar episode OD  Emergent consult to vascular surgery sent per patient's request    2- PCIOL  F/u own ophthalmologist     Signs and symptoms of retinal tears and retinal detachment were reviewed in details with the patient. Patient was instructed to immediately present for evaluation or seek medical help if increased flashes, increased floaters, any decrease in vision, or curtain in field of vision.    F/u 675mr sooner prn  Oct, optos

## 2022-06-30 NOTE — Telephone Encounter
Returned pt call, he had to postpone surgery and is anxious to get it rescheduled.  Has appointment with eye specialist this afternoon.  Reassured him we would get him rescheduled when he is cleared to proceed.  He states understanding.

## 2022-07-03 ENCOUNTER — Encounter: Admit: 2022-07-03 | Discharge: 2022-07-03 | Payer: MEDICARE

## 2022-07-03 DIAGNOSIS — 1 ERRONEOUS ENCOUNTER--DISREGARD: Secondary | ICD-10-CM

## 2022-07-03 NOTE — Telephone Encounter
Patient called to check on referral status, please contact

## 2022-07-03 NOTE — Telephone Encounter
Patient called in on 07/03/22 at 1:06 pm, stating he is needing to schedule an appointment his eye doctor told him that he has some arteries that need to be cleaned out before vision is lost, please contact-SM

## 2022-07-03 NOTE — Progress Notes
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

## 2022-07-03 NOTE — Telephone Encounter
Patient contacted the clinic today asking about status of referral.  Pt was seen last Friday and in [place] notes, it was documented that an emergent consult to vascular surgery was sent per patient request.  We do not see this in the O2 system.  Routing to nurse Amy to assist.

## 2022-07-04 ENCOUNTER — Encounter: Admit: 2022-07-04 | Discharge: 2022-07-04 | Payer: MEDICARE

## 2022-07-04 NOTE — Telephone Encounter
Mr. Joshua Sherman called to inquire about a retinal artery occlusion diagnosis that he says he recently received.  He informed me that he is scheduled to see an ophthalmologist at [facility] tomorrow and has already consulted with his primary care physician.     I provided him with information about retinal artery occlusions and emphasized the importance of discussing potential treatments with his ophthalmologist. I advised Mr. Joshua Sherman that the eye specialist would be the best resource for exploring any potential treatment options tailored to his specific condition. Additionally, I discussed the significance of secondary stroke prevention with the patient, highlighting the importance of ongoing care and risk management.  He plans to address this during his ophthalmology appointment.    After our conversation, I shared my contact information with Mr. Joshua Sherman via [place] messaging should he have any questions or concerns moving forward.

## 2022-07-04 NOTE — Telephone Encounter
Pt called and stated what is this appt for cause he's 2 hours away and he needs to know what he's returning for

## 2022-07-05 ENCOUNTER — Encounter: Admit: 2022-07-05 | Discharge: 2022-07-05 | Payer: MEDICARE

## 2022-07-05 ENCOUNTER — Ambulatory Visit: Admit: 2022-07-05 | Discharge: 2022-07-06 | Payer: MEDICARE

## 2022-07-05 DIAGNOSIS — M1990 Unspecified osteoarthritis, unspecified site: Secondary | ICD-10-CM

## 2022-07-05 DIAGNOSIS — N529 Male erectile dysfunction, unspecified: Secondary | ICD-10-CM

## 2022-07-05 DIAGNOSIS — N400 Enlarged prostate without lower urinary tract symptoms: Secondary | ICD-10-CM

## 2022-07-05 DIAGNOSIS — N183 CKD (chronic kidney disease) stage 3, GFR 30-59 ml/min (HCC): Secondary | ICD-10-CM

## 2022-07-05 DIAGNOSIS — R32 Unspecified urinary incontinence: Secondary | ICD-10-CM

## 2022-07-05 DIAGNOSIS — H9190 Unspecified hearing loss, unspecified ear: Secondary | ICD-10-CM

## 2022-07-05 DIAGNOSIS — G709 Myoneural disorder, unspecified: Secondary | ICD-10-CM

## 2022-07-05 DIAGNOSIS — H547 Unspecified visual loss: Secondary | ICD-10-CM

## 2022-07-05 DIAGNOSIS — N289 Disorder of kidney and ureter, unspecified: Secondary | ICD-10-CM

## 2022-07-05 DIAGNOSIS — I10 Essential (primary) hypertension: Secondary | ICD-10-CM

## 2022-07-05 DIAGNOSIS — H269 Unspecified cataract: Secondary | ICD-10-CM

## 2022-07-05 DIAGNOSIS — I6523 Occlusion and stenosis of bilateral carotid arteries: Secondary | ICD-10-CM

## 2022-07-05 DIAGNOSIS — G7000 Myasthenia gravis without (acute) exacerbation: Secondary | ICD-10-CM

## 2022-07-05 DIAGNOSIS — K635 Polyp of colon: Secondary | ICD-10-CM

## 2022-07-05 DIAGNOSIS — G453 Amaurosis fugax: Secondary | ICD-10-CM

## 2022-07-05 NOTE — Progress Notes
Date of Service: 07/05/2022              Chief Complaint   Patient presents with    New Patient     Carotid Artery Stenosis       History of Present Illness    82 year old male who apparently woke up with left eye near complete vision loss only some small peripheral vision left.    This is not resolved.    He saw an ophthalmologist to was concern for central retinal artery occlusion.    He had an MRI of the brain which showed no intracranial issues he had a duplex of the neck which suggested moderate left carotid stenosis but had some acoustic shadowing and did not really comment on soft plaque ulceration etc.    Given the symptoms I think he should have better imaging of the vessel and the neck especially as 50 to 69% stenosis in the right situation is actually reasonable for surgery.    We had a long discussion regarding this I will plan on obtaining a CT angiogram of the neck and then we will follow-up with him afterwards.    We had a long discussion regarding the nature of carotid stenosis and the indications for intervention if he has even a moderate stenosis with soft hemorrhagic plaque or concerning looking plaque this would be considered a symptomatic lesion I would probably recommend either carotid stenting or carotid arterectomy based on CT findings if it is less than 50% I would not recommend any intervention for sure.    Further recommendation follow after CTA of the neck.    His creatinine was 1.8 we will have him get prehydration first.    On aspirin if he is going to need carotid invention he will also need to be started on statin and potentially Plavix as well.    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    Hearing reduced 2000    Wear hearing aids    Hypertension 1995    Meds    Incontinence 2022    Awaken every 2 hours    Kidney disease 2000    MRI every 6 months    MG (myasthenia gravis) (HCC)     Neuromuscular disorder (HCC) 1977    Mild case of Myasthenia gravis    Vision decreased 1995    Use Glasses       Surgical History:   Procedure Laterality Date    COLONOSCOPY  2000    A small polyp removed    CYSTOURETHROSCOPY      Just observed twice    HX APPENDECTOMY  1952    Done at [facility]    HX CATARACT REMOVAL  2017    HX SKIN BIOPSY  2020    Checked twice a year    HX TONSILLECTOMY  1950    Partial    HX VASECTOMY  09-1967    PENIS SURGERY  05-18-21    penile implant    THYMECTOMY  1982    Because it was enlarged       Allergies:  Allergies   Allergen Reactions  Lisinopril COUGH    Ragweed Pollen SEE COMMENTS     hayfever       Medication List:   aspirin 81 mg chewable tablet     Flaxseed Oil 1,000 mg cap Take one thousand capsules by mouth daily.    Ginger (Zingiber officinalis) (GINGER EXTRACT) 250 mg cap Take  by mouth.    hydroCHLOROthiazide (HYDRODIURIL) 25 mg tablet Take one tablet by mouth daily.    losartan (COZAAR) 100 mg tablet Take one tablet by mouth daily.    multivitamin (ONE-A-DAY) tablet Take one tablet by mouth daily.    predniSONE (DELTASONE) 5 mg tablet Take one tablet by mouth daily.    spironolactone (ALDACTONE) 25 mg tablet Take one-half tablet to one tablet by mouth daily as needed.       Social History:   reports that he has never smoked. He has never used smokeless tobacco. He reports that he does not drink alcohol and does not use drugs.    Family History   Problem Relation Age of Onset    Unknown to Patient Mother         Adoption       Review of Systems            Vitals:    07/05/22 1422   BP: (!) 168/85   BP Source: Arm, Right Upper   Pulse: 85   PainSc: Zero   Weight: 72.6 kg (160 lb)   Height: 172.7 cm (5' 8)     Body mass index is 24.33 kg/m?Marland Kitchen     Physical Exam        Assessment and Plan:    1. Bilateral carotid artery stenosis CTA NECK WO/W CONT    CREATININE    AMB REFERRAL TO INFUSION THERAPY      2. Amaurosis fugax of left eye  CREATININE    AMB REFERRAL TO INFUSION THERAPY             Total time 20 minutes.  Estimated counseling time 10 minutes.  Counseled patient  regarding  findings, options, need for further imaging, reason for this, plan if carotid is significantly stenotic.  I spent >10 minutes personally reviewing outside duplex, MRI and hospital records .

## 2022-07-06 DIAGNOSIS — H3412 Central retinal artery occlusion, left eye: Secondary | ICD-10-CM

## 2022-07-07 ENCOUNTER — Encounter: Admit: 2022-07-07 | Discharge: 2022-07-07 | Payer: MEDICARE

## 2022-07-10 ENCOUNTER — Encounter: Admit: 2022-07-10 | Discharge: 2022-07-10 | Payer: MEDICARE

## 2022-07-10 DIAGNOSIS — I6523 Occlusion and stenosis of bilateral carotid arteries: Secondary | ICD-10-CM

## 2022-07-11 ENCOUNTER — Encounter: Admit: 2022-07-11 | Discharge: 2022-07-11 | Payer: MEDICARE

## 2022-07-12 ENCOUNTER — Encounter: Admit: 2022-07-12 | Discharge: 2022-07-12 | Payer: MEDICARE

## 2022-07-13 ENCOUNTER — Encounter: Admit: 2022-07-13 | Discharge: 2022-07-13 | Payer: MEDICARE

## 2022-07-13 ENCOUNTER — Inpatient Hospital Stay: Admit: 2022-07-13 | Discharge: 2022-07-13 | Payer: MEDICARE

## 2022-07-13 DIAGNOSIS — I6522 Occlusion and stenosis of left carotid artery: Secondary | ICD-10-CM

## 2022-07-13 MED ORDER — CEFAZOLIN 2 GRAM IV SOLR
2 g | Freq: Once | INTRAVENOUS | 0 refills
Start: 2022-07-13 — End: ?

## 2022-07-14 ENCOUNTER — Encounter: Admit: 2022-07-14 | Discharge: 2022-07-14 | Payer: MEDICARE

## 2022-07-19 ENCOUNTER — Encounter: Admit: 2022-07-19 | Discharge: 2022-07-19 | Payer: MEDICARE

## 2022-07-19 ENCOUNTER — Ambulatory Visit: Admit: 2022-07-19 | Discharge: 2022-07-20 | Payer: MEDICARE

## 2022-07-19 DIAGNOSIS — H3412 Central retinal artery occlusion, left eye: Secondary | ICD-10-CM

## 2022-07-19 DIAGNOSIS — R32 Unspecified urinary incontinence: Secondary | ICD-10-CM

## 2022-07-19 DIAGNOSIS — N183 CKD (chronic kidney disease) stage 3, GFR 30-59 ml/min (HCC): Secondary | ICD-10-CM

## 2022-07-19 DIAGNOSIS — H547 Unspecified visual loss: Secondary | ICD-10-CM

## 2022-07-19 DIAGNOSIS — Z01818 Encounter for other preprocedural examination: Secondary | ICD-10-CM

## 2022-07-19 DIAGNOSIS — N289 Disorder of kidney and ureter, unspecified: Secondary | ICD-10-CM

## 2022-07-19 DIAGNOSIS — M1990 Unspecified osteoarthritis, unspecified site: Secondary | ICD-10-CM

## 2022-07-19 DIAGNOSIS — N400 Enlarged prostate without lower urinary tract symptoms: Secondary | ICD-10-CM

## 2022-07-19 DIAGNOSIS — I10 Essential (primary) hypertension: Secondary | ICD-10-CM

## 2022-07-19 DIAGNOSIS — H9190 Unspecified hearing loss, unspecified ear: Secondary | ICD-10-CM

## 2022-07-19 DIAGNOSIS — K635 Polyp of colon: Secondary | ICD-10-CM

## 2022-07-19 DIAGNOSIS — G7000 Myasthenia gravis without (acute) exacerbation: Secondary | ICD-10-CM

## 2022-07-19 DIAGNOSIS — G709 Myoneural disorder, unspecified: Secondary | ICD-10-CM

## 2022-07-19 DIAGNOSIS — N529 Male erectile dysfunction, unspecified: Secondary | ICD-10-CM

## 2022-07-19 DIAGNOSIS — H269 Unspecified cataract: Secondary | ICD-10-CM

## 2022-07-19 NOTE — Unmapped
[facility] Anesthesia Pre-Procedure Evaluation    Name: Joshua Sherman      MRN: [id number]     DOB: [birth year]     Age: 82 y.o.     Sex: male   _________________________________________________________________________     Procedure Info:   Procedure Information       Date/Time: 07/21/22 1250    Procedure: LEFT CAROTID ENDARTERECTOMY (Left) - 1.5 hrs, CERVICAL BLOCK    Location: MAIN OR 11 / Main OR/Periop    Surgeons: Lonell Grandchild, MD            Physical Assessment  Vital Signs (last filed in past 24 hours):  Height: 172.7 cm (5' 8) (03/27 1150)  Weight: 72.6 kg (160 lb) (03/27 1150)      Patient History   Allergies   Allergen Reactions    Lisinopril COUGH    Ragweed Pollen SEE COMMENTS     hayfever        Current Medications    Medication Directions   amLODIPine (NORVASC) 5 mg tablet Take one tablet by mouth at bedtime daily.   aspirin 81 mg chewable tablet Chew one tablet by mouth at bedtime daily.   Flaxseed Oil 1,000 mg cap Take one thousand capsules by mouth daily.   Ginger (Zingiber officinalis) (GINGER EXTRACT) 250 mg cap Take one capsule by mouth daily.   hydroCHLOROthiazide (HYDRODIURIL) 25 mg tablet Take one tablet by mouth daily.   losartan (COZAAR) 100 mg tablet Take one tablet by mouth daily.   multivitamin (ONE-A-DAY) tablet Take one tablet by mouth daily.   predniSONE (DELTASONE) 5 mg tablet Take one tablet by mouth daily.   spironolactone (ALDACTONE) 25 mg tablet Take one-half tablet to one tablet by mouth daily as needed. Indications: high blood pressure       Review of Systems/Medical History        PONV Screening: Non-smoker    No history of anesthetic complications    No family history of anesthetic complications      Airway - negative        Pulmonary - negative      Not a current smoker        Cardiovascular       Recent diagnostic studies:          ECG and echocardiogram      Exercise tolerance: >4 METS (8.23 METs per DASI; denies CP, pressure, DOE)      Beta Blocker therapy: No      Beta blockers within 24 hours: n/a      Hypertension (140-160/80s)                PVD (symptomatic L carotid stenosis)        GI/Hepatic/Renal              Renal disease (CR:1.8- 2.0): 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)      Autoimmune disease (MG)        BPH    Constitution - negative       Physical Exam    Airway Findings      Mallampati: III      TM distance: <3 FB      Neck ROM: full      Mouth opening: good      Upper Lip Bite Test: 1    Dental Findings: Negative      Neurological Findings:       Alert and oriented x 3    Constitutional findings:       No acute distress      Well-developed       Diagnostic Tests  Hematology:   Lab Results   Component Value Date    HGB 15.7 06/16/2022    HCT 47.7 06/16/2022    PLTCT 289 06/16/2022    WBC 12.7 06/16/2022    MCV 93.9 06/16/2022    MCH 30.9 06/16/2022    MCHC 32.9 06/16/2022    MPV 9.0 06/16/2022    RDW 13.7 06/16/2022         General Chemistry:   Lab Results   Component Value Date    NA 136 06/16/2022    K 4.5 06/16/2022    CL 99 06/16/2022    CO2 29.5 06/16/2022    BUN 45.0 06/16/2022    CR 1.8 06/16/2022    GLU 109 06/16/2022    CA 8.8 06/16/2022      Coagulation: No results found for: PT, PTT, INR      Labs 07/17/2022  K+ 4.5, Cr 1.6, Hgb 15.9, Plt 276k      EKG 05/2016 (CE)  NSR with HR; 78      MRA Head 06/28/2022   Unremarkable MR angiogram of the head. No evidence of intracranial aneurysm, vascular malformation or large branch occlusion.       CTA Neck 07/06/2022  #1.  Calcified atherosclerosis in the bilateral carotid bulbs and proximal cervical internal carotid arteries, resulting in approximately 62% diameter stenosis in the right proximal cervical ICA and approximately 65% diameter stenosis in the left proximal cervical internal carotid artery.  2.  Noncalcified right upper lobe pulmonary nodule measuring 5 to 6 mm.  Follow-up CT in 1 year is recommended.  3.  Bilateral thyroid lobe nodules measuring up to 1.5 cm on the right and 1.2 cm on the left.  Thyroid ultrasound recommended.  4.  Multilevel cervical spine degenerative changes, worse spanning C3-C7.      ECHO 06/28/2022  Summary:   1. - Left ventricle: There is moderate focal basal and mild concentric hypertrophy. Systolic function is normal. The estimated ejection fraction is 55-60%. Grade I diastolic dysfunction. -   2. - Aortic valve: The valve is trileaflet. The leaflets are mildly calcified. There is trace regurgitation. The peak systolic velocity is 1.2 m/sec. -   3. - Mitral valve: There is mild regurgitation. -   4. - Pulmonary arteries: There is no evidence of pulmonary hypertension. -   5. - Tricuspid valve: The regurgitant peak velocity is 2.7 m/sec. -       PAC RISK ASSESSMENTS:   *Duke Activity Status Index (DASI): 44.7  , Calculated METS: 8.23   (score <  25 correlates with increased risk for death, MI, and moderate to severe complications, max score 57)  *Revised Cardiac Risk Index (RCRI): 0 points=0.4% risk for major adverse cardiac events (myocardial infarction, pulmonary edema, cardiac arrest)  *STOP-BANG Score: 4   (total 5-8 high risk for OSA, 3-4 with HCO3 >28 also high risk. OSA associated with greater than twice the odds for respiratory failure, cardiac events, ICU admission, and difficult intubation)      PAC Plan    Interview: Telehealth Interview    ASA Score: 3    Anesthesia Options Discussed: MAC and Regional                  DOS Orders:  T&C        BMP, CBC DOS per Dr. Hollie Beach  Labs in CE reviewed 07/17/2022- stable          Alerts

## 2022-07-21 ENCOUNTER — Encounter: Admit: 2022-07-21 | Discharge: 2022-07-21 | Payer: MEDICARE

## 2022-07-21 ENCOUNTER — Inpatient Hospital Stay: Admit: 2022-07-21 | Discharge: 2022-07-21 | Payer: MEDICARE

## 2022-07-21 DIAGNOSIS — H9190 Unspecified hearing loss, unspecified ear: Secondary | ICD-10-CM

## 2022-07-21 DIAGNOSIS — I10 Essential (primary) hypertension: Secondary | ICD-10-CM

## 2022-07-21 DIAGNOSIS — G7000 Myasthenia gravis without (acute) exacerbation: Secondary | ICD-10-CM

## 2022-07-21 DIAGNOSIS — G709 Myoneural disorder, unspecified: Secondary | ICD-10-CM

## 2022-07-21 DIAGNOSIS — R32 Unspecified urinary incontinence: Secondary | ICD-10-CM

## 2022-07-21 DIAGNOSIS — M1990 Unspecified osteoarthritis, unspecified site: Secondary | ICD-10-CM

## 2022-07-21 DIAGNOSIS — K635 Polyp of colon: Secondary | ICD-10-CM

## 2022-07-21 DIAGNOSIS — H269 Unspecified cataract: Secondary | ICD-10-CM

## 2022-07-21 DIAGNOSIS — N183 CKD (chronic kidney disease) stage 3, GFR 30-59 ml/min (HCC): Secondary | ICD-10-CM

## 2022-07-21 DIAGNOSIS — N289 Disorder of kidney and ureter, unspecified: Secondary | ICD-10-CM

## 2022-07-21 DIAGNOSIS — H3412 Central retinal artery occlusion, left eye: Secondary | ICD-10-CM

## 2022-07-21 DIAGNOSIS — H547 Unspecified visual loss: Secondary | ICD-10-CM

## 2022-07-21 DIAGNOSIS — N400 Enlarged prostate without lower urinary tract symptoms: Secondary | ICD-10-CM

## 2022-07-21 DIAGNOSIS — N529 Male erectile dysfunction, unspecified: Secondary | ICD-10-CM

## 2022-07-21 MED ORDER — DEXMEDETOMIDINE 4 MCG/ML (INFUSION)(AM)(OR)
INTRAVENOUS | 0 refills | Status: DC
Start: 2022-07-21 — End: 2022-07-21
  Administered 2022-07-21: 13:00:00 .3 ug/kg/h via INTRAVENOUS

## 2022-07-21 MED ORDER — CEFAZOLIN 1 GRAM IJ SOLR
INTRAVENOUS | 0 refills | Status: DC
Start: 2022-07-21 — End: 2022-07-21

## 2022-07-21 MED ORDER — GLYCOPYRROLATE 0.2 MG/ML IJ SOLN
INTRAVENOUS | 0 refills | Status: DC
Start: 2022-07-21 — End: 2022-07-21

## 2022-07-21 MED ORDER — LIDOCAINE (PF) 20 MG/ML (2 %) IJ SOLN
0 refills | Status: CP
Start: 2022-07-21 — End: ?

## 2022-07-21 MED ORDER — HEPARIN (PORCINE) 1,000 UNIT/ML IJ SOLN
INTRAVENOUS | 0 refills | Status: DC
Start: 2022-07-21 — End: 2022-07-21

## 2022-07-21 MED ORDER — DEXAMETHASONE SODIUM PHOSPHATE 10 MG/ML IJ SOLN
0 refills | Status: CP
Start: 2022-07-21 — End: ?

## 2022-07-21 MED ORDER — LIDOCAINE (PF) 10 MG/ML (1 %) IJ SOLN
SUBCUTANEOUS | 0 refills | Status: CP
Start: 2022-07-21 — End: ?

## 2022-07-21 MED ORDER — PROTAMINE 10 MG/ML IV SOLN
INTRAVENOUS | 0 refills | Status: DC
Start: 2022-07-21 — End: 2022-07-21

## 2022-07-21 MED ORDER — ROPIVACAINE (PF) 5 MG/ML (0.5 %) IJ SOLN
0 refills | Status: CP
Start: 2022-07-21 — End: ?

## 2022-07-21 MED ORDER — PHENYLEPHRINE 40 MCG/ML IN NS IV DRIP (STD CONC)
INTRAVENOUS | 0 refills | Status: DC
Start: 2022-07-21 — End: 2022-07-21
  Administered 2022-07-21 (×2): .2 ug/kg/min via INTRAVENOUS

## 2022-07-21 MED ORDER — DEXMEDETOMIDINE IN 0.9 % NACL 20 MCG/5 ML (4 MCG/ML) IV SYRG
INTRAVENOUS | 0 refills | Status: DC
Start: 2022-07-21 — End: 2022-07-21

## 2022-07-21 MED ADMIN — OXYCODONE 5 MG PO TAB [10814]: 5 mg | ORAL | @ 16:00:00 | Stop: 2022-07-21 | NDC 00904696661

## 2022-07-21 MED ADMIN — ASPIRIN 81 MG PO CHEW [680]: 81 mg | ORAL | @ 18:00:00 | NDC 00904679430

## 2022-07-21 MED ADMIN — HEPARIN (PORCINE) 1,000 UNIT/ML IJ SOLN [10176]: 500 mL | @ 13:00:00 | Stop: 2022-07-21 | NDC 63323054011

## 2022-07-21 MED ADMIN — LACTATED RINGERS IV SOLP [4318]: 500 mL | @ 13:00:00 | Stop: 2022-07-21 | NDC 00338011703

## 2022-07-21 MED ADMIN — ACETAMINOPHEN 500 MG PO TAB [102]: 1000 mg | ORAL | @ 21:00:00 | Stop: 2022-07-21 | NDC 00904673061

## 2022-07-21 MED ADMIN — SODIUM CHLORIDE 0.9 % IR SOLN [11403]: 1000 mL | @ 13:00:00 | Stop: 2022-07-21 | NDC 00338004804

## 2022-07-21 MED ADMIN — SODIUM CHLORIDE 0.9 % IV SOLP [27838]: 1000 mL | INTRAVENOUS | @ 12:00:00 | Stop: 2022-07-21 | NDC 00338004904

## 2022-07-21 MED ADMIN — CEFAZOLIN 1 GRAM IJ SOLR [1445]: 1000 mL | @ 13:00:00 | Stop: 2022-07-21 | NDC 00143992490

## 2022-07-21 MED ADMIN — PREDNISONE 5 MG PO TAB [6497]: 5 mg | ORAL | @ 20:00:00 | NDC 60687012211

## 2022-07-21 MED ADMIN — LABETALOL 5 MG/ML IV SYRG [86579]: 5 mg | INTRAVENOUS | @ 18:00:00 | NDC 00409233924

## 2022-07-22 ENCOUNTER — Encounter: Admit: 2022-07-22 | Discharge: 2022-07-22 | Payer: MEDICARE

## 2022-07-22 MED ADMIN — OXYCODONE 5 MG PO TAB [10814]: 5 mg | ORAL | @ 10:00:00 | Stop: 2022-07-22 | NDC 00904696661

## 2022-07-22 MED ADMIN — LABETALOL 5 MG/ML IV SYRG [86579]: 5 mg | INTRAVENOUS | @ 01:00:00 | NDC 00409233924

## 2022-07-22 MED ADMIN — OXYCODONE 5 MG PO TAB [10814]: 5 mg | ORAL | @ 05:00:00 | Stop: 2022-07-22 | NDC 00904696661

## 2022-07-22 MED ADMIN — HYDROCHLOROTHIAZIDE 25 MG PO TAB [3720]: 25 mg | ORAL | @ 13:00:00 | Stop: 2022-07-22 | NDC 23155000801

## 2022-07-22 MED ADMIN — LABETALOL 5 MG/ML IV SYRG [86579]: 5 mg | INTRAVENOUS | @ 10:00:00 | Stop: 2022-07-22 | NDC 00409233924

## 2022-07-22 MED ADMIN — ASPIRIN 81 MG PO CHEW [680]: 81 mg | ORAL | @ 13:00:00 | Stop: 2022-07-22 | NDC 66553000201

## 2022-07-22 MED ADMIN — PREDNISONE 5 MG PO TAB [6497]: 5 mg | ORAL | @ 13:00:00 | Stop: 2022-07-22 | NDC 60687012211

## 2022-07-22 MED ADMIN — LABETALOL 5 MG/ML IV SYRG [86579]: 10 mg | INTRAVENOUS | @ 02:00:00 | Stop: 2022-07-22 | NDC 00409233924

## 2022-07-22 MED ADMIN — OXYCODONE 5 MG PO TAB [10814]: 5 mg | ORAL | @ 01:00:00 | NDC 00904696661

## 2022-07-22 MED ADMIN — AMLODIPINE 5 MG PO TAB [79041]: 5 mg | ORAL | @ 01:00:00 | NDC 00904637061

## 2022-07-23 ENCOUNTER — Encounter: Admit: 2022-07-23 | Discharge: 2022-07-23 | Payer: MEDICARE

## 2022-07-23 DIAGNOSIS — N529 Male erectile dysfunction, unspecified: Secondary | ICD-10-CM

## 2022-07-23 DIAGNOSIS — R32 Unspecified urinary incontinence: Secondary | ICD-10-CM

## 2022-07-23 DIAGNOSIS — H9190 Unspecified hearing loss, unspecified ear: Secondary | ICD-10-CM

## 2022-07-23 DIAGNOSIS — G709 Myoneural disorder, unspecified: Secondary | ICD-10-CM

## 2022-07-23 DIAGNOSIS — M1990 Unspecified osteoarthritis, unspecified site: Secondary | ICD-10-CM

## 2022-07-23 DIAGNOSIS — H269 Unspecified cataract: Secondary | ICD-10-CM

## 2022-07-23 DIAGNOSIS — H3412 Central retinal artery occlusion, left eye: Secondary | ICD-10-CM

## 2022-07-23 DIAGNOSIS — N183 CKD (chronic kidney disease) stage 3, GFR 30-59 ml/min (HCC): Secondary | ICD-10-CM

## 2022-07-23 DIAGNOSIS — K635 Polyp of colon: Secondary | ICD-10-CM

## 2022-07-23 DIAGNOSIS — I10 Essential (primary) hypertension: Secondary | ICD-10-CM

## 2022-07-23 DIAGNOSIS — H547 Unspecified visual loss: Secondary | ICD-10-CM

## 2022-07-23 DIAGNOSIS — G7000 Myasthenia gravis without (acute) exacerbation: Secondary | ICD-10-CM

## 2022-07-23 DIAGNOSIS — N400 Enlarged prostate without lower urinary tract symptoms: Secondary | ICD-10-CM

## 2022-07-23 DIAGNOSIS — N289 Disorder of kidney and ureter, unspecified: Secondary | ICD-10-CM

## 2022-07-26 ENCOUNTER — Encounter: Admit: 2022-07-26 | Discharge: 2022-07-26 | Payer: MEDICARE

## 2022-07-26 NOTE — Telephone Encounter
Patient has questions concerning his upcoming post op appointment.  Please call patient at [phone] to discuss further.

## 2022-07-26 NOTE — Telephone Encounter
I attempted to reach the patient at [phone] and LMM asking for a call back to discuss concerns.

## 2022-08-03 ENCOUNTER — Encounter: Admit: 2022-08-03 | Discharge: 2022-08-03 | Payer: MEDICARE

## 2022-08-03 DIAGNOSIS — I6523 Occlusion and stenosis of bilateral carotid arteries: Secondary | ICD-10-CM

## 2022-08-03 NOTE — Telephone Encounter
Received email from [place] [facility] staff member informing our service that patient was not started on a cholesterol medication pre or post surgery. Spoke with Dr. Hollie Beach- V.O please start patient on Rosuvastatin 10mg  daily and have fasting lipid labs drawn 6 weeks after initiation (week of May 20th).    Spoke with patient, he stated that he has Rosuvastatin on hand d/t his wife formerly being on this medication. Confirmed that this was the correct medication and dosage. Patient would like to get refills from his PCP, stated he will call their office. Patient requests labs be sent to [facility] in his hometown since he lives 2 hours away. Will send orders to this location.

## 2022-08-08 ENCOUNTER — Encounter: Admit: 2022-08-08 | Discharge: 2022-08-08 | Payer: MEDICARE

## 2022-08-08 ENCOUNTER — Ambulatory Visit: Admit: 2022-08-08 | Discharge: 2022-08-09 | Payer: MEDICARE

## 2022-08-08 DIAGNOSIS — N289 Disorder of kidney and ureter, unspecified: Secondary | ICD-10-CM

## 2022-08-08 DIAGNOSIS — G7000 Myasthenia gravis without (acute) exacerbation: Secondary | ICD-10-CM

## 2022-08-08 DIAGNOSIS — M1990 Unspecified osteoarthritis, unspecified site: Secondary | ICD-10-CM

## 2022-08-08 DIAGNOSIS — R32 Unspecified urinary incontinence: Secondary | ICD-10-CM

## 2022-08-08 DIAGNOSIS — N400 Enlarged prostate without lower urinary tract symptoms: Secondary | ICD-10-CM

## 2022-08-08 DIAGNOSIS — I639 Cerebral infarction, unspecified: Secondary | ICD-10-CM

## 2022-08-08 DIAGNOSIS — Z9889 Other specified postprocedural states: Secondary | ICD-10-CM

## 2022-08-08 DIAGNOSIS — H3412 Central retinal artery occlusion, left eye: Secondary | ICD-10-CM

## 2022-08-08 DIAGNOSIS — N183 CKD (chronic kidney disease) stage 3, GFR 30-59 ml/min (HCC): Secondary | ICD-10-CM

## 2022-08-08 DIAGNOSIS — N529 Male erectile dysfunction, unspecified: Secondary | ICD-10-CM

## 2022-08-08 DIAGNOSIS — H547 Unspecified visual loss: Secondary | ICD-10-CM

## 2022-08-08 DIAGNOSIS — I6523 Occlusion and stenosis of bilateral carotid arteries: Secondary | ICD-10-CM

## 2022-08-08 DIAGNOSIS — G709 Myoneural disorder, unspecified: Secondary | ICD-10-CM

## 2022-08-08 DIAGNOSIS — I10 Essential (primary) hypertension: Secondary | ICD-10-CM

## 2022-08-08 DIAGNOSIS — H269 Unspecified cataract: Secondary | ICD-10-CM

## 2022-08-08 DIAGNOSIS — H9190 Unspecified hearing loss, unspecified ear: Secondary | ICD-10-CM

## 2022-08-08 DIAGNOSIS — I749 Embolism and thrombosis of unspecified artery: Secondary | ICD-10-CM

## 2022-08-08 DIAGNOSIS — K635 Polyp of colon: Secondary | ICD-10-CM

## 2022-08-08 NOTE — Progress Notes
Telehealth Visit Note    Date of Service: 08/08/2022    Subjective:      I am doing all right since surgery.       Joshua Sherman is a 82 y.o. male.    History of Present Illness    This is a very pleasant 82 year old gentleman with a history of hypertension, hyperlipidemia, chronic kidney disease and carotid stenosis.  He is now status post left carotid endarterectomy by Dr. Hollie Beach on July 21, 2022.  He has no significant complaints of left neck or headache pain. He denies any lateralizing TIA or strokelike symptoms. He denies chest pain and shortness of breath.  He denies fever and chills.  He is hoping to receive clearance for a HoLEP procedure in the near future.      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  He is not diabetic.  His most recent creatinine was 1.87 with a GFR 36.  He does not smoke.    He is taking low-dose 81 mg aspirin daily.       Review of Systems      Objective:          acetaminophen (TYLENOL EXTRA STRENGTH) 500 mg tablet Take one tablet by mouth every 6 hours as needed for Pain. Max of 4,000 mg of acetaminophen in 24 hours.  Indications: pain    aspirin 81 mg chewable tablet Chew one tablet by mouth at bedtime daily.    Flaxseed Oil 1,000 mg cap Take one thousand capsules by mouth daily.    Ginger (Zingiber officinalis) (GINGER EXTRACT) 250 mg cap Take one capsule by mouth daily.    hydroCHLOROthiazide (HYDRODIURIL) 25 mg tablet Take one tablet by mouth daily.    losartan (COZAAR) 100 mg tablet Take one tablet by mouth daily.    multivitamin (ONE-A-DAY) tablet Take one tablet by mouth daily.    predniSONE (DELTASONE) 5 mg tablet Take one tablet by mouth daily.    spironolactone (ALDACTONE) 25 mg tablet Take one-half tablet to one tablet by mouth daily as needed. Indications: high blood pressure          Telehealth Patient Reported Vitals       Row Name 08/08/22 1101 08/08/22 1052             BP: 157/80 157/80       BP Source: Arm, Right Upper --       BP Position: Sitting -- BP Method: Automatic/Digital Reading --       Pain Score: One --                     Telehealth Body Mass Index: 24.02 at 08/08/2022 12:12 PM    Physical Exam  Vitals and nursing note reviewed.   Constitutional:       General: He is not in acute distress.     Appearance: He is well-developed and well-groomed. He is not ill-appearing or diaphoretic.   HENT:      Head: Normocephalic.   Eyes:      General:         Right eye: No discharge.         Left eye: No discharge.   Neck:      Trachea: Phonation normal.      Comments: His left neck incision is approximated, without significant redness, edema or drainage from the surgical site.  Pulmonary:      Effort: Pulmonary effort is normal. No respiratory distress.  Skin:     General: Skin is warm and dry.      Capillary Refill: Capillary refill takes less than 2 seconds.      Coloration: Skin is not cyanotic or mottled.   Neurological:      General: No focal deficit present.      Mental Status: He is alert and oriented to person, place, and time.      Motor: Motor function is intact.      Comments: His smile is symmetrical.  Tongue protrudes midline.  He can shrug his shoulders.  There is no arm drift.   Psychiatric:         Attention and Perception: Attention normal.         Mood and Affect: Mood normal.         Speech: Speech normal.         Behavior: Behavior normal. Behavior is cooperative.         Thought Content: Thought content normal.              Assessment and Plan:    Bilateral carotid stenosis    Left carotid endarterectomy    Primary hypertension      He is doing well status post left carotid endarterectomy.  He has had no significant left neck or headache pain.  He denies any lateralizing TIA or strokelike symptoms.  I discussed with him my recommendations for follow-up.  His preoperative right carotid artery had approximately 50 to 69% stenosis which does not put him at increased risk of stroke at this time.    He should continue his low-dose aspirin daily.    We will follow-up with a limited left carotid surveillance ultrasound which [facility] [place].  Presuming that test is unremarkable, we will follow-up again in 6 months with a bilateral carotid surveillance ultrasound and office visit at that time.    He may proceed with his HoLEP prostate procedure.  If he needs to stop his low-dose aspirin for the procedure, that is fine.  He needs to resume it as soon as safely possible.  If he does not need to stop the aspirin for the procedure that is even better.    We gave him information on carotid disease and carotid endarterectomy for his visit summary.  He had no additional questions or concerns for me today.       Kathaleen Maser, APRN-NP    23 minutes spent on this patient's encounter with counseling and coordination of care taking >50% of the visit.

## 2022-08-08 NOTE — Patient Instructions
Continue taking all medications as prescribed.     Continue to monitor and maintain your blood pressure and cholesterol. Keep a log of your blood pressure when you check it at home. Take all your medication as prescribed.      You may proceed with Prostate surgery. You may hold the aspirin during the procedure if needed. However we want you to resume aspirin as soon as it is safe to do so.     We have asked [facility] in [place], [place]  to call you to schedule your testing.  If you have not heard from them in 3-4 business days please call  to schedule testing.  Please note: nothing to eat or drink within 4 hours of this test.  ONCE TESTING HAS BEEN COMPLETED PLEASE CALL OUR OFFICE TO LET us KNOW SO WE CAN ENSURE YOU RECEIVE YOUR RESULTS IN A TIMELY MANNER. Failure to let us know may result in a delay of care.  Our phone number is [phone].

## 2022-08-09 ENCOUNTER — Encounter: Admit: 2022-08-09 | Discharge: 2022-08-09 | Payer: MEDICARE

## 2022-08-09 ENCOUNTER — Ambulatory Visit: Admit: 2022-08-09 | Discharge: 2022-08-10 | Payer: MEDICARE

## 2022-08-09 DIAGNOSIS — I6522 Occlusion and stenosis of left carotid artery: Secondary | ICD-10-CM

## 2022-08-09 DIAGNOSIS — H269 Unspecified cataract: Secondary | ICD-10-CM

## 2022-08-09 DIAGNOSIS — R32 Unspecified urinary incontinence: Secondary | ICD-10-CM

## 2022-08-09 DIAGNOSIS — N529 Male erectile dysfunction, unspecified: Secondary | ICD-10-CM

## 2022-08-09 DIAGNOSIS — N289 Disorder of kidney and ureter, unspecified: Secondary | ICD-10-CM

## 2022-08-09 DIAGNOSIS — H3412 Central retinal artery occlusion, left eye: Secondary | ICD-10-CM

## 2022-08-09 DIAGNOSIS — N183 CKD (chronic kidney disease) stage 3, GFR 30-59 ml/min (HCC): Secondary | ICD-10-CM

## 2022-08-09 DIAGNOSIS — I639 Cerebral infarction, unspecified: Secondary | ICD-10-CM

## 2022-08-09 DIAGNOSIS — H9190 Unspecified hearing loss, unspecified ear: Secondary | ICD-10-CM

## 2022-08-09 DIAGNOSIS — I749 Embolism and thrombosis of unspecified artery: Secondary | ICD-10-CM

## 2022-08-09 DIAGNOSIS — G709 Myoneural disorder, unspecified: Secondary | ICD-10-CM

## 2022-08-09 DIAGNOSIS — H547 Unspecified visual loss: Secondary | ICD-10-CM

## 2022-08-09 DIAGNOSIS — N400 Enlarged prostate without lower urinary tract symptoms: Secondary | ICD-10-CM

## 2022-08-09 DIAGNOSIS — M1990 Unspecified osteoarthritis, unspecified site: Secondary | ICD-10-CM

## 2022-08-09 DIAGNOSIS — I10 Essential (primary) hypertension: Secondary | ICD-10-CM

## 2022-08-09 DIAGNOSIS — G453 Amaurosis fugax: Secondary | ICD-10-CM

## 2022-08-09 DIAGNOSIS — N1832 Stage 3b chronic kidney disease (HCC): Secondary | ICD-10-CM

## 2022-08-09 DIAGNOSIS — K635 Polyp of colon: Secondary | ICD-10-CM

## 2022-08-09 DIAGNOSIS — G7000 Myasthenia gravis without (acute) exacerbation: Secondary | ICD-10-CM

## 2022-08-09 NOTE — Pre-Anesthesia Patient Instructions
PREPROCEDURE INFORMATION    Arrival at the hospital  [facility]  [address]  [place], [place] [address]    Park in the [employer], located directly across from the main entrance to the hospital.  Enter through the ground floor main hospital entrance and check in at the Information Desk in the lobby.  They will validate your parking ticket and direct you to the next location.    You will receive a call with your surgery arrival time between 2:30pm and 4:30pm the last business day before your procedure.  If you do not receive a call, please call [phone] before 4:30pm or [phone] after 4:30pm.    Eating or drinking before surgery  Nothing to eat after 11:00pm the night before your surgery including gum, mints and candy.  You may have clear liquids up to 2 hours before your surgery time.  Clear Liquid Examples include:  Water, Clear juice (apple or cranberry (no pulp or orange juice) - If diabetic blood sugar must be <200, Coffee and tea with or without sugar (no cream), Sports drink - Powerade/Gatorade, Soda, and Bowel Prep solutions only if ordered by surgeon.    Planning transportation for outpatient procedure  For your safety, you will need to arrange for a responsible ride/person to accompany you home due to sedation or anesthesia with your procedure.  An [employer] Goad, taxi or other public transportation driver is not considered a responsible person to accompany you home.    Bath/Shower Instructions  Take a bath or shower with antibacterial soap the night before and the morning of your procedure. Use clean towels.  Put on clean clothes after bath or shower.  Avoid using lotion and oils.  Sleep on clean sheets if bath or shower is done the night before procedure.    Morning of your procedure:  Brush your teeth and tongue  Do not smoke, vape, chew or user any tobacco products.  Do not shave the area where you will have surgery.  Remove nail polish, makeup and all jewelry (including piercings) before coming to the hospital.  Dress in clean, loose, comfortable clothing.    Valuables  Leave money, credit cards, jewelry, and any other valuables at home. If medications are being filled and picked up at a [facility] pharmacy, payment will be needed. [facility] is not responsible for the loss or breakage of personal items.    What to bring to the hospital  ID/Insurance card  Medical Device card  Official documents for legal guardianship  Copy of your Living Will, Advanced Directives, and/or Durable Power of Attorney. If you have these documents, please bring them to the admissions office on the day of your surgery to be scanned into your records.  Do not bring medications from home unless instructed by a pharmacist.  CPAP/BiPAP machine (including all supplies)  Walker, cane, or motorized scooter  Cases for glasses/hearing aids/contact lens (bring solutions for contacts)  Stimulator remote  Insulin pump and supplies as directed by pharmacy     Notify us at [facility]: [phone] on the day of your procedure if:  You need to cancel your procedure.  You are going to be late.    Notify your surgeon if:  You become ill with a cough, fever, sore throat, nausea, vomiting or flu-like symptoms.  You have any open wounds/sores that are red, painful, draining, or are new since you last saw the doctor.  You need to cancel your  procedure.    Preparing to get your medications at discharge  Your surgeon may prescribe you medications to take after your procedure.  If you like the convenience of having your medications filled here at [facility], please do the following:  Go to [facility] pharmacy after your PAC appointment to put a credit card on file.  Call [facility] pharmacy at [phone] (Monday-Friday 7am-9pm or Saturday and Sunday 9am-5pm) to put a credit card on file.  Bring a credit card or cash on the day of your procedure- please leave with a family member rather than bringing it into the preop area.    Current Visitor Policy:  Visitors must be free of fever and symptoms to be in our facilities.  No more than 2 visitors per patient are allowed.  Additional guidelines may vary, based on patient care area or patient's condition.  Patients in semiprivate rooms may have visitors, but visits should be coordinated so only two total visitors are in a room at a time due to space limitations.  Children younger than age 12 are allowed to visit inpatients.    Thank you for participating in your Preoperative Assessment visit today.    If you have any changes to your health or hospitalizations between now and your surgery, please call us at [phone].    Instructions given to patient via: MyChart and verbal

## 2022-08-09 NOTE — Unmapped
[facility] Anesthesia Pre-Procedure Evaluation    Name: Joshua Sherman      MRN: [id number]     DOB: [birth year]     Age: 82 y.o.     Sex: male   _________________________________________________________________________     Procedure Info:   Procedure Information    Date: 08/09/22  Procedure: ANESTHESIA PRE-EVAL         Physical Assessment  Vital Signs (last filed in past 24 hours):  Height: 172.7 cm (5' 8) (04/17 4540)  Weight: 72.6 kg (160 lb) (04/17 0824)      Patient History   Allergies   Allergen Reactions    Lisinopril COUGH    Ragweed Pollen SEE COMMENTS     hayfever        Current Medications    Medication Directions   acetaminophen (TYLENOL EXTRA STRENGTH) 500 mg tablet Take one tablet by mouth every 6 hours as needed for Pain. Max of 4,000 mg of acetaminophen in 24 hours.  Indications: pain   aspirin EC (ASPIR-LOW) 81 mg tablet Take one tablet by mouth at bedtime daily.   Flaxseed Oil 1,000 mg cap Take one capsule by mouth daily.   Ginger (Zingiber officinalis) (GINGER EXTRACT) 250 mg cap Take one capsule by mouth daily.   hydroCHLOROthiazide (HYDRODIURIL) 25 mg tablet Take one tablet by mouth daily.   multivitamin (ONE-A-DAY) tablet Take one tablet by mouth daily.   predniSONE (DELTASONE) 5 mg tablet Take one tablet by mouth daily.   spironolactone (ALDACTONE) 25 mg tablet Take one-half tablet to one tablet by mouth daily as needed. Indications: high blood pressure   telmisartan (MICARDIS) 80 mg tablet Take 40 mg by mouth twice daily.       Review of Systems/Medical History      Patient summary reviewed  Pertinent labs reviewed    PONV Screening: Non-smoker    No history of anesthetic complications    No family history of anesthetic complications      Airway - negative        Pulmonary - negative      Not a current smoker          No recent URI      Cardiovascular         Exercise tolerance: >4 METS      Hypertension (140-160/80s), well controlled    No valvular problems/murmurs              No palpitations PVD (symptomatic L carotid stenosis s/p L CEA 07/21/2022)        No syncope      GI/Hepatic/Renal             No GERD         Renal disease (CR:1.8- 2.0): 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)      Autoimmune disease (MG)      No malignancy        BPH    Constitution - negative       Physical Exam        Dental Findings: Negative         Diagnostic Tests  Hematology:   Lab Results   Component Value Date    HGB 14.9 07/21/2022    HCT 43.9 07/21/2022    PLTCT 251 07/21/2022    WBC 13.5 07/21/2022    MCV 93.0 07/21/2022    MCH 31.5 07/21/2022    MCHC 33.9 07/21/2022    MPV 7.1 07/21/2022    RDW 13.3 07/21/2022         General Chemistry:   Lab Results   Component Value Date    NA 137 07/21/2022    K 4.6 07/21/2022    CL 103 07/21/2022    CO2 24 07/21/2022    GAP 10 07/21/2022    BUN 48 07/21/2022    CR 1.87 07/21/2022    GLU 125 07/21/2022    CA 8.4 07/21/2022      Coagulation: No results found for: PT, PTT, INR    MRA Head 06/28/2022   Unremarkable MR angiogram of the head. No evidence of intracranial aneurysm, vascular malformation or large branch occlusion.         CTA Neck 07/06/2022  #1.  Calcified atherosclerosis in the bilateral carotid bulbs and proximal cervical internal carotid arteries, resulting in approximately 62% diameter stenosis in the right proximal cervical ICA and approximately 65% diameter stenosis in the left proximal cervical internal carotid artery.  2.  Noncalcified right upper lobe pulmonary nodule measuring 5 to 6 mm.  Follow-up CT in 1 year is recommended.  3.  Bilateral thyroid lobe nodules measuring up to 1.5 cm on the right and 1.2 cm on the left.  Thyroid ultrasound recommended.  4.  Multilevel cervical spine degenerative changes, worse spanning C3-C7.        ECHO 06/28/2022  Summary:   1. - Left ventricle: There is moderate focal basal and mild concentric hypertrophy. Systolic function is normal. The estimated ejection fraction is 55-60%. Grade I diastolic dysfunction. -   2. - Aortic valve: The valve is trileaflet. The leaflets are mildly calcified. There is trace regurgitation. The peak systolic velocity is 1.2 m/sec. -   3. - Mitral valve: There is mild regurgitation. -   4. - Pulmonary arteries: There is no evidence of pulmonary hypertension. -   5. - Tricuspid valve: The regurgitant peak velocity is 2.7 m/sec. [facility] Plan    Interview: Telehealth Interview    ASA Score: 3    Anesthesia Options Discussed: General                  DOS Orders:  BMP        PAC RISK ASSESSMENTS:   *Duke Activity Status Index (DASI): 24.2, Calculated METS: 5.72 (score < 25 correlates with increased risk for death, MI, and moderate to severe complications, max score 57)  *Revised Cardiac Risk Index (RCRI): 2 points=6.6% risk for major adverse cardiac events (myocardial infarction, pulmonary edema, cardiac arrest) (CVA/TIA, CKD)  *STOP-BANG  Score: 4 (total 5-8 high risk for OSA, 3-4 with HCO3 >28 also high risk. OSA associated with greater than twice the odds for respiratory failure, cardiac events, ICU admission, and difficult intubation)    Requests use of less muscle relaxer took muscles awhile to wake up after last surgery, does not think the weakness is attributable to stroke in early March; feels it is related to anesthesia from CEA; did advise it did not appear per anesthesia record that muscle relaxers were used.    Discussed risk of recurrent CVA being increased since doing procedure less than 6-9 months after recent stroke, patient understands and wishes to proceed.        Alerts    08/08/2022 Per Vascular Bryson Dames, APRN/Dr. Hollie Beach  He may proceed with his HoLEP prostate procedure. If he needs to stop his low-dose aspirin for the procedure, that is fine. He needs to resume it as soon as safely possible. If he does not need to stop the aspirin for the procedure that is even better.

## 2022-08-10 NOTE — Telephone Encounter
Contacted pt to offer sooner surgery date of 4/25 per Dr. Nance Pear. Pt is agreeable as long as there are no concerns w/in his care team. Informed him that per Dr. Nance Pear and vascular surgery, it is okay to proceed and he can continue his ASA81. He mentioned anesthesia had concerns when he talked with them today, but he said if it is okay to proceed then he accepts the 4/25 surgery date. No additional questions/concerns. Communicated this w/ Dr. Nance Pear.

## 2022-08-17 ENCOUNTER — Encounter: Admit: 2022-08-17 | Discharge: 2022-08-17 | Payer: MEDICARE

## 2022-08-17 ENCOUNTER — Encounter: Admit: 2022-06-28 | Discharge: 2022-06-28 | Payer: MEDICARE

## 2022-08-17 MED ADMIN — SODIUM CHLORIDE 0.9 % IV SOLP [27838]: 1000 mL | INTRAVENOUS | @ 14:00:00 | Stop: 2022-08-17 | NDC 00338004904

## 2022-08-17 MED ADMIN — LOSARTAN 50 MG PO TAB [76938]: 50 mg | ORAL | Stop: 2022-08-17 | NDC 68084034711

## 2022-08-17 MED ADMIN — SODIUM CHLORIDE 0.9 % IV SOLP [27838]: 1000 mL | INTRAVENOUS | @ 19:00:00 | Stop: 2022-08-17 | NDC 00338004904

## 2022-08-17 MED ADMIN — LABETALOL 5 MG/ML IV SYRG [86579]: 5 mg | INTRAVENOUS | @ 20:00:00 | Stop: 2022-08-17 | NDC 00409233924

## 2022-08-18 ENCOUNTER — Encounter: Admit: 2022-08-18 | Discharge: 2022-08-18 | Payer: MEDICARE

## 2022-08-18 MED ADMIN — LOSARTAN 50 MG PO TAB [76938]: 100 mg | ORAL | @ 14:00:00 | Stop: 2022-08-18 | NDC 68084034711

## 2022-08-18 MED ADMIN — HYDROCHLOROTHIAZIDE 25 MG PO TAB [3720]: 25 mg | ORAL | @ 14:00:00 | Stop: 2022-08-18 | NDC 23155000801

## 2022-08-18 MED ADMIN — SODIUM CHLORIDE 0.9 % IV SOLP [27838]: 1000.000 mL | INTRAVENOUS | @ 04:00:00 | Stop: 2022-08-19 | NDC 00338004904

## 2022-08-19 ENCOUNTER — Encounter: Admit: 2022-08-19 | Discharge: 2022-08-19 | Payer: MEDICARE

## 2022-08-19 DIAGNOSIS — N183 CKD (chronic kidney disease) stage 3, GFR 30-59 ml/min (HCC): Secondary | ICD-10-CM

## 2022-08-19 DIAGNOSIS — H3412 Central retinal artery occlusion, left eye: Secondary | ICD-10-CM

## 2022-08-19 DIAGNOSIS — M1990 Unspecified osteoarthritis, unspecified site: Secondary | ICD-10-CM

## 2022-08-19 DIAGNOSIS — K635 Polyp of colon: Secondary | ICD-10-CM

## 2022-08-19 DIAGNOSIS — H269 Unspecified cataract: Secondary | ICD-10-CM

## 2022-08-19 DIAGNOSIS — I749 Embolism and thrombosis of unspecified artery: Secondary | ICD-10-CM

## 2022-08-19 DIAGNOSIS — I10 Essential (primary) hypertension: Secondary | ICD-10-CM

## 2022-08-19 DIAGNOSIS — H9190 Unspecified hearing loss, unspecified ear: Secondary | ICD-10-CM

## 2022-08-19 DIAGNOSIS — R32 Unspecified urinary incontinence: Secondary | ICD-10-CM

## 2022-08-19 DIAGNOSIS — G709 Myoneural disorder, unspecified: Secondary | ICD-10-CM

## 2022-08-19 DIAGNOSIS — G7000 Myasthenia gravis without (acute) exacerbation: Secondary | ICD-10-CM

## 2022-08-19 DIAGNOSIS — I639 Cerebral infarction, unspecified: Secondary | ICD-10-CM

## 2022-08-19 DIAGNOSIS — N289 Disorder of kidney and ureter, unspecified: Secondary | ICD-10-CM

## 2022-08-19 DIAGNOSIS — H547 Unspecified visual loss: Secondary | ICD-10-CM

## 2022-08-19 DIAGNOSIS — N400 Enlarged prostate without lower urinary tract symptoms: Secondary | ICD-10-CM

## 2022-08-19 DIAGNOSIS — N529 Male erectile dysfunction, unspecified: Secondary | ICD-10-CM

## 2022-08-20 ENCOUNTER — Encounter: Admit: 2022-08-20 | Discharge: 2022-08-20 | Payer: MEDICARE

## 2022-08-21 ENCOUNTER — Ambulatory Visit: Admit: 2022-08-21 | Discharge: 2022-08-21 | Payer: MEDICARE

## 2022-08-21 ENCOUNTER — Encounter: Admit: 2022-08-21 | Discharge: 2022-08-21 | Payer: MEDICARE

## 2022-08-21 DIAGNOSIS — N401 Enlarged prostate with lower urinary tract symptoms: Secondary | ICD-10-CM

## 2022-08-21 MED FILL — CEPHALEXIN 500 MG PO CAP: 500 mg | ORAL | 3 days supply | Qty: 6 | Fill #1 | Status: CP

## 2022-08-21 NOTE — Progress Notes
Date of Service:  08/21/2022    Chief Complaint   Patient presents with    Post-op       S:  82 y.o. male w/ BPH s/p HoLEP with Dr. Nance Pear here for post-op TOV. Doing well.   (-)F/C/S.  Denies recent UTIs or changes in medical condition since surgery.(-)other concerns or complaints at this time.    Continues to have gross hematuria since surgery. No blood clots seen today during TOV.    Has not been taking antibiotics. Denies scheduling for PFPT. Wants another referral printed today.     O:  Gen:  Well appearing male, NAD, pleasant & cooperative, A&O x3.  TOV: 150 mL in. 140 mL voided out. TOV PASSED.     A/P:  Visit Dx:  1. BPH associated with nocturia    2. Benign prostatic hyperplasia with urinary frequency        Orders Placed This Encounter    PELVIC FLOOR REHAB       - s/p HoLEP on 08/17/22 with Dr. Nance Pear.   - Encouraged to schedule PFPT.   - Drink plenty of fluids.   - Take antibiotics that were prescribed.   - RTC in 6 weeks as scheduled.     Wallace Going, PA-C

## 2022-08-21 NOTE — Progress Notes
Patient presented to clinic today for his post op visit Trial Of Void. Balloon deflated .  sterile water instillation via catheter and then removed with approximately  140 ml sterile water voided without difficulty. His urine is still quite red but no clots and encouraged to drink between 8-10 glasses water per day to flush his bladder and maintain clear urine.

## 2022-08-24 ENCOUNTER — Encounter: Admit: 2022-08-24 | Discharge: 2022-08-24 | Payer: MEDICARE

## 2022-08-24 NOTE — Telephone Encounter
Pt LVM w/ post-op question. Returned patient's call. He stated his urine has gone from red to yellow. He reports he had been drinking 16-18 oz extra water prior to surgery and even more than that when he was experiencing hematuria. He is wanting to know if it is okay if he cuts back at night now that his urine has cleared. Informed him this is reasonable. Discussed if he experiences hematuria again then he should increase his fluid intake. Advised if it is gross hematuria to call our office. Pt v/u and denied other questions/concerns. He thanked Charity fundraiser for the call.

## 2022-08-25 ENCOUNTER — Encounter: Admit: 2022-08-25 | Discharge: 2022-08-25 | Payer: MEDICARE

## 2022-08-25 NOTE — Telephone Encounter
Received voicemail from patient with concerns that he was getting stopped up. Returned call and spoke with patient and wife. Patient reports he has been unable to urinate since last night. Wife reports there was a little bit in his brief, but otherwise he has been unable to urinate. I explained that he needs to be seen in the ER to evaluate further. Patient reports they are already in the ER, in [place], [place]. I encouraged patient to keep [place] updated. Patient and wife appreciated return call.

## 2022-09-01 ENCOUNTER — Encounter: Admit: 2022-09-01 | Discharge: 2022-09-01 | Payer: MEDICARE

## 2022-09-04 ENCOUNTER — Encounter: Admit: 2022-09-04 | Discharge: 2022-09-04 | Payer: MEDICARE

## 2022-09-04 ENCOUNTER — Ambulatory Visit: Admit: 2022-09-04 | Discharge: 2022-09-05 | Payer: MEDICARE

## 2022-09-04 DIAGNOSIS — M1990 Unspecified osteoarthritis, unspecified site: Secondary | ICD-10-CM

## 2022-09-04 DIAGNOSIS — I10 Essential (primary) hypertension: Secondary | ICD-10-CM

## 2022-09-04 DIAGNOSIS — I639 Cerebral infarction, unspecified: Secondary | ICD-10-CM

## 2022-09-04 DIAGNOSIS — N400 Enlarged prostate without lower urinary tract symptoms: Secondary | ICD-10-CM

## 2022-09-04 DIAGNOSIS — H547 Unspecified visual loss: Secondary | ICD-10-CM

## 2022-09-04 DIAGNOSIS — N183 CKD (chronic kidney disease) stage 3, GFR 30-59 ml/min (HCC): Secondary | ICD-10-CM

## 2022-09-04 DIAGNOSIS — H9190 Unspecified hearing loss, unspecified ear: Secondary | ICD-10-CM

## 2022-09-04 DIAGNOSIS — N289 Disorder of kidney and ureter, unspecified: Secondary | ICD-10-CM

## 2022-09-04 DIAGNOSIS — G709 Myoneural disorder, unspecified: Secondary | ICD-10-CM

## 2022-09-04 DIAGNOSIS — N529 Male erectile dysfunction, unspecified: Secondary | ICD-10-CM

## 2022-09-04 DIAGNOSIS — K635 Polyp of colon: Secondary | ICD-10-CM

## 2022-09-04 DIAGNOSIS — G7000 Myasthenia gravis without (acute) exacerbation: Secondary | ICD-10-CM

## 2022-09-04 DIAGNOSIS — I749 Embolism and thrombosis of unspecified artery: Secondary | ICD-10-CM

## 2022-09-04 DIAGNOSIS — H3412 Central retinal artery occlusion, left eye: Secondary | ICD-10-CM

## 2022-09-04 DIAGNOSIS — R32 Unspecified urinary incontinence: Secondary | ICD-10-CM

## 2022-09-04 DIAGNOSIS — H269 Unspecified cataract: Secondary | ICD-10-CM

## 2022-09-04 DIAGNOSIS — N401 Enlarged prostate with lower urinary tract symptoms: Secondary | ICD-10-CM

## 2022-09-04 NOTE — Patient Instructions
Resume stool softeners to have a good bowel movement every day.    Resume kegels exercises    Kegel Exercises    1.  During urination, attempt to stop your stream.  The muscle used to stop urination is called the external urinary sphincter.    2.  After you have correctly identified this muscle, you should follow a sphincter strengthening program outside of urination.    Follow this Schedule:  -- Slowly contract your sphincter muscle to a count of five.  Slowly release to a count of five.  Repeat 10 times. This is called a set.   -- It is important not to tense other muscles during this exercise.  You're your abdominal (belly), thigh, and butt muscles relaxed while you are doing Kegel exercises.    -- Repeat a set of exercises at least four times daily or up to once every hour.  To help you remember a time schedule, do a set at breakfast, lunch, dinner and bedtime.  Vary the position in which you do the exercises such as standing, lying, walking, and driving.  Different positions will strengthen the muscles in different ways.    -- To help prevent leakage with increased abdominal pressure, you may do a single Kegel squeeze and count of five when you are bending, stooping, getting up out of a chair, or bed or when lifting.    -- It is normal to experience leakage for several weeks or even several months after surgery.  Time, patience, and Kegel exercises will help restore your urinary control.    -- Guards for Men [employer]) is a pad that fits into your jockey shorts to absorb small to moderate amounts of urine.  It has an adhesive strip which adheres to the inside of your underwear.  You may purchase these pads at any major pharmacy or discount store.  For large amounts of leakage, you may wish to purchase Depends underwear.  It's a full brief that you use in place of underwear.    =============================================

## 2022-09-07 ENCOUNTER — Encounter: Admit: 2022-09-07 | Discharge: 2022-09-07 | Payer: MEDICARE

## 2022-09-22 ENCOUNTER — Encounter: Admit: 2022-09-22 | Discharge: 2022-09-22 | Payer: MEDICARE

## 2022-09-22 NOTE — Telephone Encounter
Patient LVM wanting to know if he can stop by the clinic on 6/5 or 6/6 to pick up condom catheters. Contacted pt. Advised this would be okay. He is having trouble finding Vitamin A&D ointment. He went to his local pharmacy and was recommended diaper rash cream, which has not been helpful. He is also inquiring about PFPT while he's in town. Informed him the [facility] is in [facility], but there are other locations. I was unsure if they would be able to see him that quick. He v/u. No additional questions/concerns at this time. He thanked Charity fundraiser for the call back.

## 2022-09-29 ENCOUNTER — Encounter: Admit: 2022-09-29 | Discharge: 2022-09-29 | Payer: MEDICARE

## 2022-09-29 DIAGNOSIS — I6523 Occlusion and stenosis of bilateral carotid arteries: Secondary | ICD-10-CM

## 2022-10-09 ENCOUNTER — Encounter: Admit: 2022-10-09 | Discharge: 2022-10-09 | Payer: MEDICARE

## 2022-11-10 ENCOUNTER — Encounter: Admit: 2022-11-10 | Discharge: 2022-11-10 | Payer: MEDICARE

## 2022-11-16 NOTE — Progress Notes
Urology Clinic Note  Date of Service: 11/17/2022       Subjective               Joshua Sherman is a 82 y.o. male who presents today for 3 month follow up.    History of Present Illness  82yo M with bothersome LUTS, BPH 58g gland s/p HoLEP in 07/2022 (pathology with GG1 prostate cancer in 1% of tissue).     Doing well since surgery. He is still having leakage but it is much improved. He has some urgency but he gets to the bathroom without UUI. 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 Volume of voids can be lower than expected based on urge feeling.     He reports that when he feels good from an energy wise, he has minimal to no leakage.     (+) SUI  Using Depends currently with a pad inside, he changes it 2-3 x per day if he is active. No changes at night.     He has used a condom cath - he used some cream and it was hard for it to stay on. So not using it anymore.    (-) PFPT - they don't have a local PFPT office so has not seen then.     Pathology previously reviewed via phone and opted for observation.          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    Hypertension 1995    Meds    Incontinence 2022    Awaken every 2 hours    Kidney disease 2000    MRI every 6 months    MG (myasthenia gravis) (HCC)     Neuromuscular disorder (HCC) 1977    Mild case of Myasthenia gravis    Stroke (HCC) 06-28-2022    Left eye only    Vision decreased 1995    Use Glasses     Surgical History:   Procedure Laterality Date    PENILE PROSTHESIS  04/2021 LEFT CAROTID ENDARTERECTOMY Left 07/21/2022    Performed by Lonell Grandchild, MD at [facility] OR    LASER ENUCLEATION PROSTATE WITH MORCELLATION - COMPLETE N/A 08/17/2022    Performed by Benetta Spar, MD at [facility] OR    COLONOSCOPY  2000    A small polyp removed    CYSTOURETHROSCOPY      Just observed twice    HX APPENDECTOMY  1952    Done at [facility]    HX CATARACT REMOVAL  2017    HX SKIN BIOPSY  2020    Checked twice a year    HX TONSILLECTOMY  1950    Partial    HX VASECTOMY  09-1967    RECTAL SURGERY      Hemeroid    THYMECTOMY  1982    Because it  was enlarged     Family History   Problem Relation Name Age of Onset    Unknown to Patient Mother Roque Lias         Adoption     Current Outpatient Medications   Medication Sig Dispense Refill    acetaminophen (TYLENOL EXTRA STRENGTH) 500 mg tablet Take one tablet by mouth every 6 hours as needed for Pain. Max of 4,000 mg of acetaminophen in 24 hours.  Indications: pain      aspirin EC (ASPIR-LOW) 81 mg tablet Take one tablet by mouth at bedtime daily. Hold until catheter removed.  Indications: treatment to prevent a heart attack      Flaxseed Oil 1,000 mg cap Take one capsule by mouth daily.      Ginger (Zingiber officinalis) (GINGER EXTRACT) 250 mg cap Take one capsule by mouth daily.      hydroCHLOROthiazide (HYDRODIURIL) 25 mg tablet Take one tablet by mouth daily.      multivitamin (ONE-A-DAY) tablet Take one tablet by mouth daily.      predniSONE (DELTASONE) 5 mg tablet Take one tablet by mouth daily.      spironolactone (ALDACTONE) 25 mg tablet Take one-half tablet to one tablet by mouth daily as needed. Indications: high blood pressure      telmisartan (MICARDIS) 80 mg tablet Take 40 mg by mouth twice daily.      ZZ - (INV) PREDNISONE              5 MG ORAL TABLET        No current facility-administered medications for this visit.     Allergies   Allergen Reactions    Ragweed Pollen SEE COMMENTS     hayfever     Social History     Socioeconomic History    Marital status: Married   Tobacco Use    Smoking status: Never    Smokeless tobacco: Never   Vaping Use    Vaping status: Never Used   Substance and Sexual Activity    Alcohol use: Never    Drug use: Never    Sexual activity: Yes     Partners: Female     Birth control/protection: None         Objective             acetaminophen (TYLENOL EXTRA STRENGTH) 500 mg tablet Take one tablet by mouth every 6 hours as needed for Pain. Max of 4,000 mg of acetaminophen in 24 hours.  Indications: pain    aspirin EC (ASPIR-LOW) 81 mg tablet Take one tablet by mouth at bedtime daily. Hold until catheter removed.  Indications: treatment to prevent a heart attack    Flaxseed Oil 1,000 mg cap Take one capsule by mouth daily.    Ginger (Zingiber officinalis) (GINGER EXTRACT) 250 mg cap Take one capsule by mouth daily.    hydroCHLOROthiazide (HYDRODIURIL) 25 mg tablet Take one tablet by mouth daily.    multivitamin (ONE-A-DAY) tablet Take one tablet by mouth daily.    predniSONE (DELTASONE) 5 mg tablet Take one tablet by mouth daily.    spironolactone (ALDACTONE) 25 mg tablet Take one-half tablet to one tablet by mouth daily as needed. Indications: high blood pressure    telmisartan (MICARDIS) 80 mg tablet Take 40 mg by mouth twice daily.    ZZ - (INV) PREDNISONE              5 MG ORAL TABLET      Vitals:  11/17/22 0744   PainSc: Zero     There is no height or weight on file to calculate BMI.     Physical Exam  HENT:      Head: Normocephalic.      Nose: Nose normal.   Eyes:      Extraocular Movements: Extraocular movements intact.   Pulmonary:      Effort: Pulmonary effort is normal.   Neurological:      General: No focal deficit present.      Mental Status: He is alert.   Psychiatric:         Mood and Affect: Mood normal.         Behavior: Behavior normal.         Thought Content: Thought content normal.                Assessment and Plan       Problem   Malignant Neoplasm of Prostate (Hcc)    07/2022: HoLEP with 1% GG1 Gleason 3+3=6, opts for observation  10/2022: Observation discussion again, agrees with plan     Bph Associated With Nocturia    06/16/2022 - initial evaluation for HoLEP, 58g gland, plan for OR 07/2022 - HoLEP completed  10/2022: SUI 3 depends per day. Rec PFPT         BPH associated with nocturia  82yo M with bothersome LUTS, BPH 58g gland s/p HoLEP in 07/2022 (pathology with GG1 prostate cancer in 1% of tissue).     Doing well. Having some SUI which is not unexpected. It has improved. Recommend PFPT    > PFPT referral - will have urology RN see what closest place is to patient. Discussed that even if he only went for a few visits it could still be benefitical  > RTC to see me via telehealth in 6 months    Malignant neoplasm of prostate (HCC)  Reviewed HoLEP pathology again with GG1 1%. Discussed NCCN guidelines and that based on his age and medical comorbitidies, would recommend observation. No repeat PSA testing or additional work up recommended at this time. Risks/benefits of this reviewed and patient is amenable.    > Observation for known low risk GG1 prostate cancer  > No repeat PSA testing indicated      Loralie Champagne, MD  Urology

## 2022-11-17 ENCOUNTER — Encounter: Admit: 2022-11-17 | Discharge: 2022-11-17 | Payer: MEDICARE

## 2022-11-17 ENCOUNTER — Ambulatory Visit: Admit: 2022-11-17 | Discharge: 2022-11-18 | Payer: MEDICARE

## 2022-11-17 DIAGNOSIS — G7000 Myasthenia gravis without (acute) exacerbation: Secondary | ICD-10-CM

## 2022-11-17 DIAGNOSIS — I10 Essential (primary) hypertension: Secondary | ICD-10-CM

## 2022-11-17 DIAGNOSIS — G709 Myoneural disorder, unspecified: Secondary | ICD-10-CM

## 2022-11-17 DIAGNOSIS — H547 Unspecified visual loss: Secondary | ICD-10-CM

## 2022-11-17 DIAGNOSIS — N183 CKD (chronic kidney disease) stage 3, GFR 30-59 ml/min (HCC): Secondary | ICD-10-CM

## 2022-11-17 DIAGNOSIS — H269 Unspecified cataract: Secondary | ICD-10-CM

## 2022-11-17 DIAGNOSIS — K635 Polyp of colon: Secondary | ICD-10-CM

## 2022-11-17 DIAGNOSIS — H3412 Central retinal artery occlusion, left eye: Secondary | ICD-10-CM

## 2022-11-17 DIAGNOSIS — N529 Male erectile dysfunction, unspecified: Secondary | ICD-10-CM

## 2022-11-17 DIAGNOSIS — H9190 Unspecified hearing loss, unspecified ear: Secondary | ICD-10-CM

## 2022-11-17 DIAGNOSIS — N289 Disorder of kidney and ureter, unspecified: Secondary | ICD-10-CM

## 2022-11-17 DIAGNOSIS — N401 Enlarged prostate with lower urinary tract symptoms: Secondary | ICD-10-CM

## 2022-11-17 DIAGNOSIS — N400 Enlarged prostate without lower urinary tract symptoms: Secondary | ICD-10-CM

## 2022-11-17 DIAGNOSIS — I639 Cerebral infarction, unspecified: Secondary | ICD-10-CM

## 2022-11-17 DIAGNOSIS — I749 Embolism and thrombosis of unspecified artery: Secondary | ICD-10-CM

## 2022-11-17 DIAGNOSIS — M1990 Unspecified osteoarthritis, unspecified site: Secondary | ICD-10-CM

## 2022-11-17 DIAGNOSIS — C61 Malignant neoplasm of prostate: Secondary | ICD-10-CM

## 2022-11-17 DIAGNOSIS — R32 Unspecified urinary incontinence: Secondary | ICD-10-CM

## 2022-11-17 NOTE — Assessment & Plan Note
82yo M with bothersome LUTS, BPH 58g gland s/p HoLEP in 07/2022 (pathology with GG1 prostate cancer in 1% of tissue).     Doing well. Having some SUI which is not unexpected. It has improved. Recommend PFPT    > PFPT referral - will have urology RN see what closest place is to patient. Discussed that even if he only went for a few visits it could still be benefitical  > RTC to see me via telehealth in 6 months

## 2022-11-17 NOTE — Assessment & Plan Note
Reviewed HoLEP pathology again with GG1 1%. Discussed NCCN guidelines and that based on his age and medical comorbitidies, would recommend observation. No repeat PSA testing or additional work up recommended at this time. Risks/benefits of this reviewed and patient is amenable.    > Observation for known low risk GG1 prostate cancer  > No repeat PSA testing indicated

## 2023-01-04 ENCOUNTER — Encounter: Admit: 2023-01-04 | Discharge: 2023-01-04 | Payer: MEDICARE

## 2023-01-23 ENCOUNTER — Encounter: Admit: 2023-01-23 | Discharge: 2023-01-23 | Payer: MEDICARE

## 2023-01-23 NOTE — Telephone Encounter
Patient appointment via telehealth on 02/01/23 with Dr Hollie Beach. Outbound call to patient to see if/when carotid ultrasound was done at [facility]. Patient unsure of when or if done since 08/22/2022. Patient will call [facility] and will call vascular center office back with date when completed. Patient aware if not completed, need to have done before the 10th or reschedule appointment.

## 2023-01-25 ENCOUNTER — Encounter: Admit: 2023-01-25 | Discharge: 2023-01-25 | Payer: MEDICARE

## 2023-01-31 ENCOUNTER — Encounter: Admit: 2023-01-31 | Discharge: 2023-01-31 | Payer: MEDICARE

## 2023-01-31 DIAGNOSIS — Z9889 Other specified postprocedural states: Secondary | ICD-10-CM

## 2023-01-31 DIAGNOSIS — I6523 Occlusion and stenosis of bilateral carotid arteries: Secondary | ICD-10-CM

## 2023-02-01 ENCOUNTER — Encounter: Admit: 2023-02-01 | Discharge: 2023-02-01 | Payer: MEDICARE

## 2023-02-01 ENCOUNTER — Ambulatory Visit: Admit: 2023-02-01 | Discharge: 2023-02-02 | Payer: MEDICARE

## 2023-02-01 DIAGNOSIS — M1990 Unspecified osteoarthritis, unspecified site: Secondary | ICD-10-CM

## 2023-02-01 DIAGNOSIS — E271 Primary adrenocortical insufficiency: Secondary | ICD-10-CM

## 2023-02-01 DIAGNOSIS — G7000 Myasthenia gravis without (acute) exacerbation: Secondary | ICD-10-CM

## 2023-02-01 DIAGNOSIS — H269 Unspecified cataract: Secondary | ICD-10-CM

## 2023-02-01 DIAGNOSIS — I10 Essential (primary) hypertension: Secondary | ICD-10-CM

## 2023-02-01 DIAGNOSIS — I639 Cerebral infarction, unspecified: Secondary | ICD-10-CM

## 2023-02-01 DIAGNOSIS — K635 Polyp of colon: Secondary | ICD-10-CM

## 2023-02-01 DIAGNOSIS — N289 Disorder of kidney and ureter, unspecified: Secondary | ICD-10-CM

## 2023-02-01 DIAGNOSIS — H3412 Central retinal artery occlusion, left eye: Secondary | ICD-10-CM

## 2023-02-01 DIAGNOSIS — H9190 Unspecified hearing loss, unspecified ear: Secondary | ICD-10-CM

## 2023-02-01 DIAGNOSIS — N529 Male erectile dysfunction, unspecified: Secondary | ICD-10-CM

## 2023-02-01 DIAGNOSIS — I6521 Occlusion and stenosis of right carotid artery: Secondary | ICD-10-CM

## 2023-02-01 DIAGNOSIS — I749 Embolism and thrombosis of unspecified artery: Secondary | ICD-10-CM

## 2023-02-01 DIAGNOSIS — R32 Unspecified urinary incontinence: Secondary | ICD-10-CM

## 2023-02-01 DIAGNOSIS — G709 Myoneural disorder, unspecified: Secondary | ICD-10-CM

## 2023-02-01 DIAGNOSIS — N400 Enlarged prostate without lower urinary tract symptoms: Secondary | ICD-10-CM

## 2023-02-01 DIAGNOSIS — H547 Unspecified visual loss: Secondary | ICD-10-CM

## 2023-02-01 DIAGNOSIS — N183 CKD (chronic kidney disease) stage 3, GFR 30-59 ml/min (HCC): Secondary | ICD-10-CM

## 2023-02-02 ENCOUNTER — Encounter: Admit: 2023-02-02 | Discharge: 2023-02-02 | Payer: MEDICARE

## 2023-02-02 DIAGNOSIS — I10 Essential (primary) hypertension: Secondary | ICD-10-CM

## 2023-02-02 DIAGNOSIS — H3412 Central retinal artery occlusion, left eye: Secondary | ICD-10-CM

## 2023-02-02 DIAGNOSIS — M1990 Unspecified osteoarthritis, unspecified site: Secondary | ICD-10-CM

## 2023-02-02 DIAGNOSIS — H547 Unspecified visual loss: Secondary | ICD-10-CM

## 2023-02-02 DIAGNOSIS — N289 Disorder of kidney and ureter, unspecified: Secondary | ICD-10-CM

## 2023-02-02 DIAGNOSIS — N183 CKD (chronic kidney disease) stage 3, GFR 30-59 ml/min (HCC): Secondary | ICD-10-CM

## 2023-02-02 DIAGNOSIS — K635 Polyp of colon: Secondary | ICD-10-CM

## 2023-02-02 DIAGNOSIS — H269 Unspecified cataract: Secondary | ICD-10-CM

## 2023-02-02 DIAGNOSIS — H9190 Unspecified hearing loss, unspecified ear: Secondary | ICD-10-CM

## 2023-02-02 DIAGNOSIS — I749 Embolism and thrombosis of unspecified artery: Secondary | ICD-10-CM

## 2023-02-02 DIAGNOSIS — G709 Myoneural disorder, unspecified: Secondary | ICD-10-CM

## 2023-02-02 DIAGNOSIS — I639 Cerebral infarction, unspecified: Secondary | ICD-10-CM

## 2023-02-02 DIAGNOSIS — R32 Unspecified urinary incontinence: Secondary | ICD-10-CM

## 2023-02-02 DIAGNOSIS — N400 Enlarged prostate without lower urinary tract symptoms: Secondary | ICD-10-CM

## 2023-02-02 DIAGNOSIS — G7000 Myasthenia gravis without (acute) exacerbation: Secondary | ICD-10-CM

## 2023-02-02 DIAGNOSIS — N529 Male erectile dysfunction, unspecified: Secondary | ICD-10-CM

## 2023-03-05 ENCOUNTER — Ambulatory Visit: Admit: 2023-03-05 | Discharge: 2023-03-06 | Payer: MEDICARE

## 2023-03-05 ENCOUNTER — Encounter: Admit: 2023-03-05 | Discharge: 2023-03-05 | Payer: MEDICARE

## 2023-03-05 DIAGNOSIS — H3412 Central retinal artery occlusion, left eye: Secondary | ICD-10-CM

## 2023-03-05 DIAGNOSIS — Z961 Presence of intraocular lens: Secondary | ICD-10-CM

## 2023-03-05 NOTE — Progress Notes
Body mass index is 24.33 kg/m?Joshua Sherman;  OD: Preserved foveal contour with distinguished retinal layers.  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 Planned surgery but cancelled due to vision loss     S/p left endarterectomy      1- CRAO OS  On baby aspirin  Patient will f/u with own pcp for systemic control to prevent similar episode OD  Emergent consult to vascular surgery sent per patient's request     2- PCIOL  F/u own ophthalmologist      Signs and symptoms of retinal tears and retinal detachment were reviewed in details with the patient. Patient was instructed to immediately present for evaluation or seek medical help if increased flashes, increased floaters, any decrease in vision, or curtain in field of vision.     F/u 1y or sooner prn  Oct, optos

## 2023-04-11 ENCOUNTER — Encounter: Admit: 2023-04-11 | Discharge: 2023-04-11 | Payer: MEDICARE

## 2023-04-27 ENCOUNTER — Encounter: Admit: 2023-04-27 | Discharge: 2023-04-27 | Payer: MEDICARE

## 2023-04-27 DIAGNOSIS — I739 Peripheral vascular disease, unspecified: Secondary | ICD-10-CM

## 2023-04-27 NOTE — Telephone Encounter
Patient called stating he wanted [place] to know he had a fall a couple months ago and hurt his left knee. He may talk to his PCP about getting an X-ray completed. Patient also wanted to see if he could get his appointments moved to 1/28 since he has a neurology appointment that day and lives a few hours away. Assisted patient to r/s appts to 1/28. Patient will have ultrasound at 9:30am at [facility], then will go to 11am neurology appt at the [facility]. Patient will have 12:30 office visit with Angelique Blonder at [facility] to discuss results. DTL of all appointments given to patient.

## 2023-04-27 NOTE — Telephone Encounter
Patient called in with concerns for worsening leg pains and wanting to be evaluated for PVD.  He reports for several years he would walk two miles each day. Over the last few months he noticed he is not able to walk as far due to aching pains in legs. Over the last few weeks he can only walk about 1/8th of a mile now before he has to stop due to the leg pain. He states the pains are in both legs, one does not seem worse than the other. He denies any open wounds or sores to his legs or feet, or numbness. He reports his legs and feet are still warm to touch and normal in color. I assisted to schedule the first available office visit in our clinic with ABI testing at our [facility]. Then reviewed ER precautions for limb ischemia symptoms, He voiced understanding. No further concerns voiced at this times.

## 2023-05-18 NOTE — Progress Notes
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    History of Present Illness    Patient reported intermittent diplopia. He was evaluated at the [facility] in 1977. He was given the diagnosis here based on electrodiagnostic testing. Serological workup was negative. He was trialed on Mestinon but did not respond. He has been treated with varying doses of prednisone for many years (nerve high doses) and has been on 5mg  for a Joshua Sherman time (symptoms worsen with lower doses). Reported mild worsening primarily characterized by diplopia, and ptosis over the years. Rarely intermittent dysphagia and changes to his voice. Mestinon was re-tried but caused significant diarrhea even at low doses. 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     He was re-evaluated in 2019 by Dr Dahlia Client at the [facility]. His neurological examination was normal. AChR and Musk Ab neg. Recommended staying at a 5mg  prednisone dose. In 2022, was re-evaluated again for fatigue when walking Joshua Sherman distances. RNS showed 14% and 12% decrement post exercise in the spinal accessory and facial muscles respectively. SFEMG in the R frontalis showed jitter and blocking in almost all pairs. Recommended continuing with prednisone 5mg .     Interval history  - During summer time, he feels very fatigue.   - Had acute visual loss on the left eye on 06/2022. It sounds like acute retinal artery occlusion. He had a left CEA on 07/2022.    Myasthenia Symptom Review    Vision: No visio on the left Sometimes right ptosis.   Swallowing: Normal  Talking: Normal  Chewing: Normal  Respiration: Normal  Weakness: No problems in the arms or legs    Myasthenia Medications     Mestinon: No. Even 30mg  cause prominent diarrhea. Also, not effective.   Prednisone: 5mg  daily   Azathioprine (Imuran): no  Mycophenolate (Cellcept): no  Rituximab: no   Eculizumab (Soliris): no  Efgartigimod (Vyvgart): no  Ravulizumab (Ultomiris): no  IVIg: no  PLEX: no    MG-ADL   Talking: Normal  Chewing: Normal  Swallowing: Normal  Breathing: Normal  Impairment of ability to brush teeth or comb hair: None  Impairment of ability to arise from chair: None  Double vision: None  Eyelid droop: Occurs, but not daily  MG-ADL Score: 1                               Objective:         acetaminophen (TYLENOL EXTRA STRENGTH) 500 mg tablet Take one tablet by mouth every 6 hours as needed for Pain. Max of 4,000 mg of acetaminophen in 24 hours.  Indications: pain    aspirin EC (ASPIR-LOW) 81 mg tablet Take one tablet by mouth at bedtime daily. Hold until catheter removed.  Indications: treatment to prevent a heart attack    Flaxseed Oil 1,000 mg cap Take one capsule by mouth daily.    Ginger (Zingiber officinalis) (GINGER EXTRACT) 250 mg cap Take one capsule by mouth daily.    hydroCHLOROthiazide (HYDRODIURIL) 25 mg tablet Take one tablet by mouth daily.    multivitamin (ONE-A-DAY) tablet Take one tablet by mouth daily.    PREDNISONE PO Take 5 mg by mouth  daily.    spironolactone (ALDACTONE) 25 mg tablet Take one-half tablet to one tablet by mouth daily as needed. Indications: high blood pressure    telmisartan (MICARDIS) 80 mg tablet Take 40 mg by mouth twice daily.     Vitals:    05/22/23 1054   BP: (!) 149/72   Pulse: 85   PainSc: Zero   Weight: 72.6 kg (160 lb)   Height: 172.7 cm (5' 8)     Body mass index is 24.33 kg/m?Marland Kitchen     Physical Exam    Neurological examination:   Mental Status: Alert, oriented to time, place and person   CN II thru XII: Pupils 3mm, reactive to light and accommodation, visual loss on the left, intact extraocular movements, mild ptosis on the right (R 8mm, L 9mm), no eyelid fatigability after 30s upgaze, intact facial sensation,  intact hearing, symmetric palatal elevation, able to shrug shoulders equally on both sides, tongue midline.    Orb Oculi 5   Orb Oris 5   Tongue  5     Speech: No Dysarthria  Motor Exam:    Neck flexors: 5   Neck extensors: 5      Right Left  Right Left   Shoulder abductors: 5 5 Hip flexion: 5 5   Elbow flexors: 5 5 Hip abduction: 5 5   Elbow extensors: 5 5 Hip Adduction: 5 5   Wrist extensors: 5 5 Knee extension: 5 5   Wrist flexors: 5 5 Knee flexion: 5 5   Finger flexors:  FDP 2/3  FDP 4/5  FPL 5 5 Ankle dorsiflexion: 5 5   Finger extensors: 5 5 Ankle plantar flexion: 5 5   Finger abductors: 5 5 Ankle eversion: 5 5   Thumb abductors: 5 5 Ankle inversion: 5 5   Shoulder external rotation:   Toe extension: 5 5      Toe flexion:       Tone R/L: UE: Normal tone     LE: Normal tone     Sensory:   Pinprick: normal in the feet and hands.    Reflexes:   Reflex   Right   Left     Brachioradialis   2+ 2+   Biceps    2+ 2+   Triceps 2+ 2+   Knee 2+ 2+   Ankle   2+ 2+   Jaw Jerk       Right   Left     Plantar response mute mute   Hoffman?s        Gait and Station: Able to stand without using arms. Normal based steady gait.     RNS 08/2020 - [facility]    Repetitive stimulation at 2 Hz revealed decrements in the following compound muscle action potentials: 1) right spinal accessory   with 10.5% decrement at rest and 14.4% post exercise and 2) right facial with 8.3% decrement at rest and 12.5% post exercise. 2 Hz repetitive nerve stimulation of the right ulnar nerve showed a suggestive pattern of a neuromuscular junction deficit but did not meet the threshold of 10%.    Standard needle examination of the right upper limb and thoracic paraspinals showed mildly low   amplitude, short duration motor unit potentials.  No fibrillation potentials were present.     Single fiber EMG 08/2020 - [facility]    Single fiber EMG was performed on the right frontalis muscle.  Eleven pairs were obtained   with increased jitter in 10 of  these pairs.  Blocking was also seen in 8 of the abnormal pairs.    The MCD of the study was abnormal at 91.2 [place] with an MSD of 93.9 [place].      Assessment and Plan:    # Seronegative ocular myasthenia    His symptoms, RNS and single fiber EMG are consistent with seronegative ocular myasthenia gravis. His examination only shows a very mild right ptosis, otherwise no bulbar, neck or limb involvement. The fatigue he experiences, particularly during summer time, might or might not be related to myasthenia. Fatigue itself is no indication to increase the steroids.    At this time, will continue with the same prednisone dose, as lower doses have caused worsening. No need for chronic immunosuppression as benefits outweighs the risks.     Plan  - Continue prednisone 5mg  daily. Patient will contact us when his last refill is about to run out.   - Continue exercising (walks 1 mile daily). Avoid exercising outdoor during summertime on hot days.     Follow up in 1 year    Mingo Amber, MD  Assistant Professor of Neurology  Diplomate, ABPN, Neurology  Diplomate, ABPN, Neuromuscular Medicine    Total of 60 minutes were spent on the same day of the visit including preparing to see the patient, obtaining and/or reviewing separately obtained history, performing a medically appropriate examination and/or evaluation, counseling and educating the patient/family/caregiver, ordering medications, tests, or procedures, referring and communication with other health care professionals, documenting clinical information in the electronic or other health record, independently interpreting results and communicating results to the patient/family/caregiver, and care coordination.

## 2023-05-21 ENCOUNTER — Encounter: Admit: 2023-05-21 | Discharge: 2023-05-21 | Payer: MEDICARE

## 2023-05-22 ENCOUNTER — Ambulatory Visit: Admit: 2023-05-22 | Discharge: 2023-05-23 | Payer: MEDICARE

## 2023-05-22 ENCOUNTER — Encounter: Admit: 2023-05-22 | Discharge: 2023-05-22 | Payer: MEDICARE

## 2023-05-22 ENCOUNTER — Ambulatory Visit: Admit: 2023-05-22 | Discharge: 2023-05-22 | Payer: MEDICARE

## 2023-05-22 DIAGNOSIS — I6523 Occlusion and stenosis of bilateral carotid arteries: Secondary | ICD-10-CM

## 2023-05-22 DIAGNOSIS — M79604 Pain in right leg: Secondary | ICD-10-CM

## 2023-05-22 DIAGNOSIS — Z9889 Other specified postprocedural states: Secondary | ICD-10-CM

## 2023-05-22 DIAGNOSIS — E785 Hyperlipidemia, unspecified: Secondary | ICD-10-CM

## 2023-05-22 DIAGNOSIS — I10 Essential (primary) hypertension: Secondary | ICD-10-CM

## 2023-05-22 DIAGNOSIS — G7000 Myasthenia gravis without (acute) exacerbation: Secondary | ICD-10-CM

## 2023-05-22 NOTE — Patient Instructions
IMPORTANT CLINIC INFORMATION    -- Preferred method of communication is through [employer], if the issue cannot wait until your next scheduled follow up.     -- MyChart may be used for non-emergent communication. Emails are not reviewed after hours or over the weekend/holidays/after 4PM. Staff will reply to your email within 24-48 business hours.     -- If you do not hear from [place] within one week of a lab or imaging study being completed, please call/send my chart email to the office to be sure that we have received the results. This is especially challenging when tests are done outside of the [facility] system, as many times results do not make it back to our office for a variety of reasons. In our office no news is good news does not apply. You should hear from [place] with results for each test.  [place] lab/imaging results:  Due to the CARES act, results automatically release to MyChart.  Dr. Marian Sorrow will continue to send you a result note on any labs that she orders.  With these changes you may see your results before Dr. Marian Sorrow does.   Please allow up to 72 hours for review and response to your results.     -- If you are having acute (new/sudden onset) or severe/worsening neurologic symptoms, please call 911 or seek care in ED.    -- For scheduling of IMAGING/RADIOLOGY, please call [phone] at your convenience to schedule your studies.  -- For referrals placed during the visit, if you have not heard from scheduling within one week, please call the call center at [phone] to get scheduling assistance.  -- For refills on medications, please first contact your pharmacy, who will fax a refill authorization request form to our office.  Weekdays only. Allow up to 2 business days for refills. Please plan ahead, as refills will not be filled after hours.  -- Our scheduling staff, Marisue Brooklyn may be reached at [phone] for scheduling needs.   -- Sonda Primes, LPN, may be contacted at [phone] for urgent needs. Staff will return your call within 24 business hours.     For Appointments:   -- Please try to arrive early for your appointment time to help facilitate your visit. 15 minutes early is recommended.   -- If you are late to your appointment, we reserve the right to ask you to reschedule or wait until next available time to be seen in fairness to other patients scheduled that day.   -- There are times when we are running behind in clinic. Our goal is to always be on time, however, there are time when unexpected events occur with patients, which may cause a delay. We appreciate your understanding when this occurs.

## 2023-05-22 NOTE — Progress Notes
Date of Service: 05/22/2023              Chief Complaint   Patient presents with    Follow Up     LEG PAIN PATIENT HAD ABI'S.        History of Present Illness    This is a very pleasant 83 year old gentleman with a history of hypertension, hyperlipidemia, myasthenia gravis, BPH, chronic kidney disease and carotid stenosis.  He had called in with complaints of new onset leg pain and was scheduled for ABIs and an office visit.  He reported that several weeks ago, he had gone walking in a pair shoes that are not walking shoes.  After about a half a mile, his legs were hurting.  His legs continue to hurt for about a week before the pain got better.  He says he thinks it was a false alarm because he walked a mile last night without any problems whatsoever.    His ABIs were 1.39 on the right with a TBI of 1.28 and he had an ABI on the left of 1.27 with a TBI of 0.85.  He had triphasic arterial waveforms bilaterally throughout.  He has no complaints of ischemic rest pain or tissue breakdown in either foot.  And as just stated, he had no complaints of claudication with walking last night.    He denies chest pain and shortness of breath.  He denies fever and chills.  He denies any new lateralizing TIA or strokelike symptoms.    He had left carotid endarterectomy by Dr. Hollie Beach on July 21, 2022.     He has no history of coronary artery intervention, bypass grafting, pacemaker or defibrillator implantation.  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  He has no history of radiation or chemotherapy.    He is not diabetic.  His most recent creatinine was 1.56 with a GFR 44.  He does not smoke.    He is taking low-dose 81 mg aspirin daily.        Past Medical History:    Arthritis    Cancer (HCC)    Cataract    Central retinal artery occlusion of left eye    CKD (chronic kidney disease) stage 3, GFR 30-59 ml/min (HCC)    Colon polyps    Coronary artery disease    Embolism and thrombosis of unspecified artery (HCC) Enlarged prostate    Erectile dysfunction    Gout    Hearing reduced    Hypertension    Incontinence    Kidney disease    MG (myasthenia gravis) (HCC)    Neuromuscular disorder (HCC)    Stroke (HCC)    Vision decreased       Surgical History:   Procedure Laterality Date    PENILE PROSTHESIS  04/2021    LEFT CAROTID ENDARTERECTOMY Left 07/21/2022    Performed by Lonell Grandchild, MD at [facility] OR    LASER ENUCLEATION PROSTATE WITH MORCELLATION - COMPLETE N/A 08/17/2022    Performed by Benetta Spar, MD at [facility] OR    COLONOSCOPY  2000    A small polyp removed    CYSTOURETHROSCOPY      Just observed twice    HX APPENDECTOMY  1952    Done at [facility] [place]    HX CATARACT REMOVAL  2017    HX SKIN BIOPSY  2020    Checked twice a year    HX TONSILLECTOMY  1950  Partial    HX VASECTOMY  [id number]    RECTAL SURGERY      Hemeroid    THYMECTOMY  1982    Because it was enlarged       Allergies:  Allergies   Allergen Reactions    Sulfa (Sulfonamide Antibiotics) HIVES    Ragweed Pollen SEE COMMENTS     hayfever       Medication List:   acetaminophen (TYLENOL EXTRA STRENGTH) 500 mg tablet Take one tablet by mouth every 6 hours as needed for Pain. Max of 4,000 mg of acetaminophen in 24 hours.  Indications: pain    acyclovir (ZOVIRAX) 400 mg tablet Take  by mouth.    amLODIPine (NORVASC) 5 mg tablet Take  by mouth.    aspirin EC (ASPIR-LOW) 81 mg tablet Take one tablet by mouth at bedtime daily. Hold until catheter removed.  Indications: treatment to prevent a heart attack    finasteride (PROSCAR) 5 mg tablet Take  by mouth.    Flaxseed Oil 1,000 mg cap Take one capsule by mouth daily.    Ginger (Zingiber officinalis) (GINGER EXTRACT) 250 mg cap Take one capsule by mouth daily.    hydroCHLOROthiazide (HYDRODIURIL) 25 mg tablet Take one tablet by mouth daily.    multivitamin (ONE-A-DAY) tablet Take one tablet by mouth daily.    PREDNISONE PO Take 5 mg by mouth daily.    spironolactone (ALDACTONE) 25 mg tablet Take one-half tablet to one tablet by mouth daily as needed. Indications: high blood pressure    telmisartan (MICARDIS) 80 mg tablet Take 40 mg by mouth twice daily.       Social History:   reports that he has never smoked. He has never used smokeless tobacco. He reports that he does not drink alcohol and does not use drugs.    Family History   Problem Relation Name Age of Onset    Unknown to Patient Mother Garnette         Adoption    Ataxia Mother Adopted        Review of Systems            Vitals:    05/22/23 1243 05/22/23 1244   BP: 117/59 113/59   BP Source: Arm, Left Upper Arm, Right Upper   Pulse: 93 96   Temp: 36.4 ?C (97.5 ?F)    Resp: 18    TempSrc: Oral    PainSc: Zero    Weight: 74.7 kg (164 lb 9.6 oz)    Height: 172.7 cm (5' 8)      Body mass index is 25.03 kg/m?Marland Kitchen     Physical Exam  Vitals and nursing note reviewed. Exam conducted with a chaperone present.   Constitutional:       General: He is not in acute distress.     Appearance: Normal appearance. He is well-developed, well-groomed and normal weight. He is not ill-appearing or diaphoretic.   HENT:      Head: Normocephalic.   Eyes:      General:         Right eye: No discharge.         Left eye: No discharge.   Neck:      Vascular: Carotid bruit present.      Trachea: Phonation normal.   Cardiovascular:      Rate and Rhythm: Normal rate and regular rhythm.      Pulses:           Carotid pulses are  on the right side with bruit and  on the left side with bruit.       Radial pulses are 2+ on the right side and 2+ on the left side.        Dorsalis pedis pulses are 2+ on the right side and 2+ on the left side.        Posterior tibial pulses are 2+ on the right side and 2+ on the left side.      Heart sounds: Murmur heard.   Pulmonary:      Effort: Pulmonary effort is normal. No respiratory distress.      Breath sounds: Normal breath sounds.   Musculoskeletal:      Cervical back: Normal range of motion and neck supple.      Right foot: Normal range of motion. No deformity. Left foot: Normal range of motion. No deformity.   Feet:      Right foot:      Skin integrity: Dry skin present. No ulcer, blister, skin breakdown or erythema.      Left foot:      Skin integrity: Dry skin present. No ulcer, blister, skin breakdown or erythema.   Skin:     General: Skin is warm and dry.      Capillary Refill: Capillary refill takes less than 2 seconds.      Coloration: Skin is not cyanotic or mottled.   Neurological:      General: No focal deficit present.      Mental Status: He is alert and oriented to person, place, and time.      Motor: Motor function is intact.      Coordination: Coordination is intact.      Gait: Gait is intact.      Comments: His grip strength is strong and equal bilaterally.   Psychiatric:         Attention and Perception: Attention and perception normal.         Mood and Affect: Mood and affect normal.         Speech: Speech normal.         Behavior: Behavior normal. Behavior is cooperative.         Thought Content: Thought content normal.         Cognition and Memory: Cognition and memory normal.             Assessment and Plan:    1. Pain in both lower extremities-resolved        2. Bilateral carotid artery stenosis        3. History of left-sided carotid endarterectomy- L CEA  07/21/22        4. Primary hypertension        5. Dyslipidemia          His ABIs are normal and he has strong palpable pedal pulses.  He had a transient episode of leg pain after walking for a half a mile in a pair of shoes that he says were not meant for walking in.  The pain went away and last night he was able to walk a mile and his regular walking shoes without any pain.  He has no signs or symptoms of ischemic rest pain or tissue breakdown in either foot, and no symptoms of claudication as of last night.  I discussed with him his results and my recommendations for follow-up.  He has no evidence of significant peripheral arterial disease.  We will continue to follow him for  his carotid disease.    He should continue his low-dose aspirin daily.    He is due in April for a bilateral carotid surveillance ultrasound and office visit at that time.    He had no additional questions or concerns for me today.    Kathaleen Maser, APRN-NP    >  24     Minutes spent on chart review, physical exam and documentation.

## 2023-05-31 ENCOUNTER — Encounter: Admit: 2023-05-31 | Discharge: 2023-05-31 | Payer: MEDICARE

## 2023-07-11 ENCOUNTER — Encounter: Admit: 2023-07-11 | Discharge: 2023-07-11 | Payer: MEDICARE

## 2023-07-11 NOTE — Telephone Encounter
 Patient just wanting Doctor to know that he completed the ultrasound that was ordered yesterday and [facility] should be sending a copy of this report.

## 2023-07-13 ENCOUNTER — Encounter: Admit: 2023-07-13 | Discharge: 2023-07-13 | Payer: MEDICARE

## 2023-07-19 NOTE — Progress Notes
 Urology Clinic Note  Date of Service: 07/20/2023   Telehealth visit. Consent obtained. Patient identity confirmed. Wife present as well.     Subjective               Joshua Sherman is a 83 y.o. male who presents for LUTS follow up.    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     Doing well since last seen, doing better all the time he says    Was having SUI when last seen.   Did complete PFPT, has been doing kegels still. Doing them variable amounts, weekly basis.  Leaks a small amount depending on how much activity he is doing   Wearing depends. Can wear for a whole day/night. Changes the pad in it once we day.     Incontinence is insensate usually.  (-) urge urinary incontinence  Not bothered by incontinence overall          Past Medical History:    Arthritis    Cancer (CMS-HCC)    Cataract    Central retinal artery occlusion of left eye    CKD (chronic kidney disease) stage 3, GFR 30-59 ml/min (CMS-HCC)    Colon polyps    Coronary artery disease    Embolism and thrombosis of unspecified artery (CMS-HCC)    Enlarged prostate    Erectile dysfunction    Gout    Hearing reduced    Hypertension    Incontinence    Kidney disease    MG (myasthenia gravis) (CMS-HCC)    Neuromuscular disorder (CMS-HCC)    Stroke (CMS-HCC)    Vision decreased     Surgical History:   Procedure Laterality Date    PENILE PROSTHESIS  04/2021    LEFT CAROTID ENDARTERECTOMY Left 07/21/2022    Performed by Lonell Grandchild, MD at [facility] OR    LASER ENUCLEATION PROSTATE WITH MORCELLATION - COMPLETE N/A 08/17/2022    Performed by Benetta Spar, MD at [facility] OR    COLONOSCOPY  2000    A small polyp removed    CYSTOURETHROSCOPY      Just observed twice    HX APPENDECTOMY  1952    Done at [facility] [place]    HX CATARACT REMOVAL  2017    HX SKIN BIOPSY  2020 Checked twice a year    HX TONSILLECTOMY  1950    Partial    HX VASECTOMY  09-1967    RECTAL SURGERY      Hemeroid    THYMECTOMY  1982    Because it was enlarged     Family History   Problem Relation Name Age of Onset    Unknown to Patient Mother Garnette         Adoption    Ataxia Mother Adopted      Current Outpatient Medications   Medication Sig Dispense Refill    acetaminophen (TYLENOL EXTRA STRENGTH) 500 mg tablet Take one tablet by mouth every 6 hours as needed for Pain. Max of 4,000 mg of acetaminophen in 24 hours.  Indications: pain      acyclovir (ZOVIRAX) 400 mg tablet Take  by mouth.      amLODIPine (NORVASC) 5 mg tablet Take  by mouth.      aspirin EC (ASPIR-LOW) 81 mg tablet Take one tablet  by mouth at bedtime daily. Hold until catheter removed.  Indications: treatment to prevent a heart attack      finasteride (PROSCAR) 5 mg tablet Take  by mouth.      Flaxseed Oil 1,000 mg cap Take one capsule by mouth daily.      Ginger (Zingiber officinalis) (GINGER EXTRACT) 250 mg cap Take one capsule by mouth daily.      hydroCHLOROthiazide (HYDRODIURIL) 25 mg tablet Take one tablet by mouth daily.      multivitamin (ONE-A-DAY) tablet Take one tablet by mouth daily.      PREDNISONE PO Take 5 mg by mouth daily.      spironolactone (ALDACTONE) 25 mg tablet Take one-half tablet to one tablet by mouth daily as needed. Indications: high blood pressure      telmisartan (MICARDIS) 80 mg tablet Take 40 mg by mouth twice daily.       No current facility-administered medications for this visit.     Allergies   Allergen Reactions    Sulfa (Sulfonamide Antibiotics) HIVES    Ragweed Pollen SEE COMMENTS     hayfever     Social History     Socioeconomic History    Marital status: Married   Tobacco Use    Smoking status: Never    Smokeless tobacco: Never   Vaping Use    Vaping status: Never Used   Substance and Sexual Activity    Alcohol use: Never    Drug use: Never    Sexual activity: Yes     Partners: Female     Birth control/protection: None         Objective             acetaminophen (TYLENOL EXTRA STRENGTH) 500 mg tablet Take one tablet by mouth every 6 hours as needed for Pain. Max of 4,000 mg of acetaminophen in 24 hours.  Indications: pain    acyclovir (ZOVIRAX) 400 mg tablet Take  by mouth.    amLODIPine (NORVASC) 5 mg tablet Take  by mouth.    aspirin EC (ASPIR-LOW) 81 mg tablet Take one tablet by mouth at bedtime daily. Hold until catheter removed.  Indications: treatment to prevent a heart attack    finasteride (PROSCAR) 5 mg tablet Take  by mouth.    Flaxseed Oil 1,000 mg cap Take one capsule by mouth daily.    Ginger (Zingiber officinalis) (GINGER EXTRACT) 250 mg cap Take one capsule by mouth daily.    hydroCHLOROthiazide (HYDRODIURIL) 25 mg tablet Take one tablet by mouth daily.    multivitamin (ONE-A-DAY) tablet Take one tablet by mouth daily.    PREDNISONE PO Take 5 mg by mouth daily.    spironolactone (ALDACTONE) 25 mg tablet Take one-half tablet to one tablet by mouth daily as needed. Indications: high blood pressure    telmisartan (MICARDIS) 80 mg tablet Take 40 mg by mouth twice daily.     There were no vitals filed for this visit.  There is no height or weight on file to calculate BMI.     Physical Exam  Constitutional:       Appearance: He is well-developed.   HENT:      Head: Normocephalic and atraumatic.   Eyes:      Conjunctiva/sclera: Conjunctivae normal.   Neck:      Trachea: No tracheal deviation.   Cardiovascular:      Rate and Rhythm: Normal rate.   Pulmonary:      Effort: Pulmonary effort is normal.  Neurological:      Mental Status: He is alert and oriented to person, place, and time.   Psychiatric:         Mood and Affect: Mood normal.         Behavior: Behavior normal.         Thought Content: Thought content normal.                Assessment and Plan       Problem   Bph Associated With Nocturia    06/16/2022 - initial evaluation for HoLEP, 58g gland, plan for OR 07/2022 - HoLEP completed  10/2022: SUI 3 depends per day. 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     Continue kegels   Follow up PRN      Loralie Champagne, MD  Urology

## 2023-07-20 ENCOUNTER — Ambulatory Visit: Admit: 2023-07-20 | Discharge: 2023-07-21

## 2023-07-20 ENCOUNTER — Encounter: Admit: 2023-07-20 | Discharge: 2023-07-20 | Payer: MEDICARE

## 2023-07-20 DIAGNOSIS — N401 Enlarged prostate with lower urinary tract symptoms: Secondary | ICD-10-CM

## 2023-07-20 NOTE — Assessment & Plan Note
 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     Continue kegels   Follow up PRN

## 2023-08-24 ENCOUNTER — Encounter: Admit: 2023-08-24 | Discharge: 2023-08-24 | Payer: MEDICARE

## 2023-09-12 ENCOUNTER — Encounter: Admit: 2023-09-12 | Discharge: 2023-09-12 | Payer: MEDICARE

## 2023-11-13 ENCOUNTER — Encounter: Admit: 2023-11-13 | Discharge: 2023-11-13 | Payer: MEDICARE

## 2024-01-01 ENCOUNTER — Encounter: Admit: 2024-01-01 | Discharge: 2024-01-01 | Payer: MEDICARE

## 2024-01-01 DIAGNOSIS — I6523 Occlusion and stenosis of bilateral carotid arteries: Principal | ICD-10-CM

## 2024-01-09 ENCOUNTER — Encounter: Admit: 2024-01-09 | Discharge: 2024-01-09 | Payer: MEDICARE

## 2024-01-09 NOTE — Telephone Encounter
 I spoke with the patient who was concerned since his spouse was with him when he got his CT scan done Monday 01/07/2024 and due to her health issues they needed to leave after the scan. So he got the IV fluids before the CT scan but not after. The person who did they scan told him to just drink three 20 oz bottles of water  after the scan that day then four 20 oz bottles the next day. He stated that he did that but he just wanted to make sure that was ok. He denies any issues of acute kidney failure ( decreased urine output, swelling, nausea, shortness of breath, etc.) He just reports increased fatigued but also reports he gets that normally due to his hx of myasthenia gravis. I explained I would let Dr. Pauleen know happen and see if he had any other concerns. I reviewed if he started to get the any of the above acute kidney symptoms he needed to seek medical attention in the ER. He voiced understanding.

## 2024-01-09 NOTE — Telephone Encounter
 Patient reporting that he has not felt well since he completed a CT scan that was ordered by Dr. Pauleen and would like to talk to a nurse about it.  Please call patient back at [phone]

## 2024-01-30 ENCOUNTER — Encounter: Admit: 2024-01-30 | Discharge: 2024-01-30 | Payer: MEDICARE

## 2024-02-11 ENCOUNTER — Encounter: Admit: 2024-02-11 | Discharge: 2024-02-11 | Payer: MEDICARE

## 2024-02-13 ENCOUNTER — Encounter: Admit: 2024-02-13 | Discharge: 2024-02-13 | Payer: MEDICARE

## 2024-02-14 ENCOUNTER — Encounter: Admit: 2024-02-14 | Discharge: 2024-02-14 | Payer: MEDICARE

## 2024-02-15 ENCOUNTER — Encounter: Admit: 2024-02-15 | Discharge: 2024-02-15 | Payer: MEDICARE

## 2024-02-19 ENCOUNTER — Encounter: Admit: 2024-02-19 | Discharge: 2024-02-19 | Payer: MEDICARE

## 2024-02-25 ENCOUNTER — Encounter: Admit: 2024-02-25 | Discharge: 2024-02-25 | Payer: MEDICARE

## 2024-02-26 ENCOUNTER — Encounter: Admit: 2024-02-26 | Discharge: 2024-02-26 | Payer: MEDICARE

## 2024-02-26 VITALS — BP 155/68 | HR 95 | Temp 97.50000°F | Resp 12 | Ht 68.5 in | Wt 164.8 lb

## 2024-02-26 DIAGNOSIS — D492 Neoplasm of unspecified behavior of bone, soft tissue, and skin: Principal | ICD-10-CM

## 2024-02-26 NOTE — Patient Instructions [37]
 Please do not hesitate to reach out with any questions or concerns. The best way to reach [place] is through MyChart. Please assign your message to Dr. Opal Sidles so it comes directly to our staff.   P: [phone] (S[facility] Main)  P: [phone] (Nurse Line)  F: [phone]

## 2024-02-26 NOTE — Progress Notes [1]
 *This content was generated with the assistance of artificial intelligence. While all notes are reviewed for relevance and accuracy before finalizing, there may be some minor errors or omissions from todays visit.  If you are a patient reviewing your note and notice an error, it is your right to reach out to ask for a correction.    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 Negative Tinel's sign. Full range of motion and strength. Biopsy not recommended due to risk of pain and bleeding.  - Schedule ultrasound in six months to monitor size.  - Schedule follow-up in one year to reassess status.  - Advised to report changes in size or pain.               ________________________________________________     HISTORY OF PRESENT ILLNESS: Joshua Sherman is a 83 y.o. male.     Joshua Sherman is an 83 year old male who presents with a right shoulder mass. He was referred by Dr. Idella following an incidental finding on an MRI.    Four months ago, he experienced a fall, leading to an MRI of his arm that revealed a mass in the right shoulder area. A follow-up MRI with contrast confirmed the presence of the mass. He has no symptoms related to the mass, such as nerve pain or changes in strength. His pain from the fall has largely resolved.           PHYSICAL EXAM:  Vitals:    02/26/24 1348   BP: (!) 155/68   Pulse: 95   Temp: 36.4 ?C (97.5 ?F)   Resp: 12   SpO2: 98%           MUSCULOSKELETAL: Full strength in the right upper extremity. Negative Tinel's sign over the mass. The mass is firm, mobile, and non-tender, located on the right shoulder.               RADIOLOGY  Right shoulder MRI: Benign nerve sheath tumor within the nerve, asymptomatic, firm, mobile, non-tender (10/2023)           IMAGING: All imaging discussed was independently reviewed and interpreted by provider      PAST MEDICAL HISTORY:   Past Medical History:    Accidental fall    Arthritis    Cancer (CMS-HCC)    Cancer of prostate (CMS-HCC)    Cataract    Central retinal artery occlusion of left eye    CKD (chronic kidney disease) stage 3, GFR 30-59 ml/min (CMS-HCC)    Colon polyps    Coronary artery disease    Embolism and thrombosis of unspecified artery (CMS-HCC)    Enlarged prostate    Erectile dysfunction    Gout    Hearing reduced    Hypertension    Incontinence    Kidney disease    MG (myasthenia gravis) (CMS-HCC)    Neuromuscular disorder (CMS-HCC)    Stroke (CMS-HCC)    Vision decreased       PAST SURGICAL HISTORY:   Surgical History:   Procedure Laterality Date    PENILE PROSTHESIS  04/2021    LEFT CAROTID ENDARTERECTOMY Left 07/21/2022    Performed by Pauleen Omar LABOR, MD at [facility] OR    LASER ENUCLEATION PROSTATE WITH MORCELLATION - COMPLETE N/A 08/17/2022    Performed by Collins Kasandra NOVAK, MD at [facility] OR    COLONOSCOPY  2000    A small polyp removed    CYSTOURETHROSCOPY      Just observed twice    EYE SURGERY  6 years ago    HX APPENDECTOMY  1952    Done at [facility] [place]    HX CATARACT REMOVAL  2017    HX SKIN BIOPSY  2020    Checked twice a year    HX TONSILLECTOMY  1950    Partial    HX VASECTOMY  [id number]    RECTAL SURGERY      Hemeroid    THYMECTOMY  1982    Because it was enlarged       FAMILY HISTORY:   Family History   Problem Relation Name Age of Onset    Unknown to Patient Mother Garnette         Adoption    Ataxia Mother Adopted        SOCIAL HISTORY:  reports that he has never smoked. He has never used smokeless tobacco. He reports that he does not drink alcohol and does not use drugs.    MEDICATIONS: Current Medications[1]    ALLERGIES: Allergies[2]        [1] Current Outpatient Medications:     acetaminophen  (TYLENOL  EXTRA STRENGTH) 500 mg tablet, Take one tablet by mouth every 6 hours as needed for Pain. Max of 4,000 mg of acetaminophen  in 24 hours.  Indications: pain, Disp: , Rfl:     acyclovir (ZOVIRAX) 400 mg tablet, Take  by mouth. (Patient not taking: Reported on 02/26/2024), Disp: , Rfl:     amLODIPine  (NORVASC ) 5 mg tablet, Take  by mouth., Disp: , Rfl:     aspirin  EC (ASPIR-LOW) 81 mg tablet, Take one tablet by mouth at bedtime daily. Hold until catheter removed.  Indications: treatment to prevent a heart attack, Disp: , Rfl:     finasteride (PROSCAR) 5 mg tablet, Take  by mouth. (Patient not taking: Reported on 02/26/2024), Disp: , Rfl:     Flaxseed Oil 1,000 mg cap, Take one capsule by mouth daily., Disp: , Rfl:     Ginger (Zingiber officinalis) (GINGER EXTRACT) 250 mg cap, Take one capsule by mouth daily., Disp: , Rfl:     hydroCHLOROthiazide  (HYDRODIURIL ) 25 mg tablet, Take one tablet by mouth daily., Disp: , Rfl:     multivitamin (ONE-A-DAY) tablet, Take one tablet by mouth daily., Disp: , Rfl:     PEG 3350-Electrolytes-Vit C (MOVIPREP) 100-7.5-2.691 gram solution, As directed (Patient not taking: Reported on 02/26/2024), Disp: 1 each, Rfl: 0    PREDNISONE  PO, Take 5 mg by mouth daily., Disp: , Rfl:     spironolactone (ALDACTONE) 25 mg tablet, Take one-half tablet to one tablet by mouth daily as needed. Indications: high blood pressure (Patient not taking: Reported on 02/26/2024), Disp: , Rfl:     telmisartan (MICARDIS) 80 mg tablet, Take 40 mg by mouth twice daily. (Patient not taking: Reported on 02/26/2024), Disp: , Rfl:     valsartan (DIOVAN) 160 mg tablet, Take one tablet by mouth twice daily., Disp: , Rfl:   [2]   Allergies  Allergen Reactions    Sulfa (Sulfonamide Antibiotics) HIVES    Ragweed Pollen  SEE COMMENTS     hayfever

## 2024-02-28 ENCOUNTER — Encounter: Admit: 2024-02-28 | Discharge: 2024-02-28 | Payer: MEDICARE

## 2024-02-28 ENCOUNTER — Ambulatory Visit: Admit: 2024-02-28 | Discharge: 2024-02-29 | Payer: MEDICARE

## 2024-02-28 DIAGNOSIS — I6523 Occlusion and stenosis of bilateral carotid arteries: Principal | ICD-10-CM

## 2024-03-10 ENCOUNTER — Encounter: Admit: 2024-03-10 | Discharge: 2024-03-10 | Payer: MEDICARE

## 2024-03-10 NOTE — Telephone Encounter [36]
 Pt LVM that he will be in town visiting his wife in the hospital this upcoming Friday and was wondering if he could see Dr. Collins for a quick thank you and a check up. He states he wanted to complement her on a job well done. Contacted pt. Apologized that unfortunately Dr. Collins is not in the office the next three weeks. He v/u, he is doing well and just wanted to say thank you. Informed pt I will pass on his msg. He stated the Kegel exercises really helped, still does them occasionally. His wife is undergoing treatment for colon cancer. She has had chemo and will be undergoing laparoscopic surgery this week. Provided our well wishes for a smooth recovery. He v/u and thanked CHARITY FUNDRAISER for the call.
